# Patient Record
Sex: Male | Born: 1935 | Race: White | Hispanic: No | Smoking: Never smoker
Health system: Southern US, Community
[De-identification: ages and names within clinical notes are randomized; demographics above are authoritative.]

## PROBLEM LIST (undated history)

## (undated) DIAGNOSIS — I2510 Atherosclerotic heart disease of native coronary artery without angina pectoris: Secondary | ICD-10-CM

## (undated) DIAGNOSIS — Z951 Presence of aortocoronary bypass graft: Secondary | ICD-10-CM

## (undated) DIAGNOSIS — N189 Chronic kidney disease, unspecified: Secondary | ICD-10-CM

## (undated) DIAGNOSIS — K279 Peptic ulcer, site unspecified, unspecified as acute or chronic, without hemorrhage or perforation: Secondary | ICD-10-CM

## (undated) DIAGNOSIS — F329 Major depressive disorder, single episode, unspecified: Secondary | ICD-10-CM

## (undated) DIAGNOSIS — E785 Hyperlipidemia, unspecified: Secondary | ICD-10-CM

## (undated) DIAGNOSIS — F32A Depression, unspecified: Secondary | ICD-10-CM

## (undated) DIAGNOSIS — G459 Transient cerebral ischemic attack, unspecified: Secondary | ICD-10-CM

## (undated) DIAGNOSIS — I10 Essential (primary) hypertension: Secondary | ICD-10-CM

## (undated) DIAGNOSIS — C9190 Lymphoid leukemia, unspecified not having achieved remission: Secondary | ICD-10-CM

## (undated) HISTORY — DX: Peptic ulcer, site unspecified, unspecified as acute or chronic, without hemorrhage or perforation: K27.9

## (undated) HISTORY — DX: Atherosclerotic heart disease of native coronary artery without angina pectoris: I25.10

## (undated) HISTORY — DX: Lymphoid leukemia, unspecified not having achieved remission: C91.90

## (undated) HISTORY — DX: Essential (primary) hypertension: I10

## (undated) HISTORY — DX: Presence of aortocoronary bypass graft: Z95.1

## (undated) HISTORY — DX: Major depressive disorder, single episode, unspecified: F32.9

## (undated) HISTORY — DX: Hyperlipidemia, unspecified: E78.5

## (undated) HISTORY — DX: Chronic kidney disease, unspecified: N18.9

## (undated) HISTORY — PX: CHOLECYSTECTOMY: SHX55

## (undated) HISTORY — DX: Transient cerebral ischemic attack, unspecified: G45.9

## (undated) HISTORY — DX: Depression, unspecified: F32.A

## (undated) HISTORY — PX: CORONARY ARTERY BYPASS GRAFT: SHX141

---

## 1996-08-17 DIAGNOSIS — Z951 Presence of aortocoronary bypass graft: Secondary | ICD-10-CM

## 1996-08-17 HISTORY — DX: Presence of aortocoronary bypass graft: Z95.1

## 2006-09-08 ENCOUNTER — Ambulatory Visit: Payer: Self-pay | Admitting: Specialist

## 2007-06-30 ENCOUNTER — Ambulatory Visit: Payer: Self-pay | Admitting: Internal Medicine

## 2008-07-20 ENCOUNTER — Ambulatory Visit: Payer: Self-pay | Admitting: Internal Medicine

## 2008-08-07 ENCOUNTER — Encounter: Payer: Self-pay | Admitting: Cardiology

## 2008-08-30 ENCOUNTER — Encounter: Payer: Self-pay | Admitting: Cardiology

## 2008-10-09 ENCOUNTER — Encounter: Payer: Self-pay | Admitting: Cardiology

## 2008-10-25 ENCOUNTER — Encounter: Payer: Self-pay | Admitting: Cardiology

## 2008-10-25 LAB — CONVERTED CEMR LAB
Cholesterol: 179 mg/dL
Triglycerides: 195 mg/dL

## 2009-04-10 ENCOUNTER — Encounter: Payer: Self-pay | Admitting: Cardiology

## 2009-04-15 ENCOUNTER — Ambulatory Visit: Payer: Self-pay | Admitting: Cardiology

## 2009-04-15 DIAGNOSIS — R079 Chest pain, unspecified: Secondary | ICD-10-CM

## 2009-04-15 DIAGNOSIS — E785 Hyperlipidemia, unspecified: Secondary | ICD-10-CM | POA: Insufficient documentation

## 2009-04-15 DIAGNOSIS — Z951 Presence of aortocoronary bypass graft: Secondary | ICD-10-CM

## 2009-04-15 DIAGNOSIS — R0602 Shortness of breath: Secondary | ICD-10-CM

## 2009-04-15 DIAGNOSIS — I25810 Atherosclerosis of coronary artery bypass graft(s) without angina pectoris: Secondary | ICD-10-CM

## 2009-04-16 ENCOUNTER — Encounter: Payer: Self-pay | Admitting: Cardiology

## 2009-04-17 ENCOUNTER — Encounter: Payer: Self-pay | Admitting: Cardiology

## 2009-04-17 ENCOUNTER — Ambulatory Visit: Payer: Self-pay | Admitting: Oncology

## 2009-04-17 LAB — CONVERTED CEMR LAB
Albumin: 4.3 g/dL
Alkaline Phosphatase: 57 units/L
BUN: 20 mg/dL
CO2: 23 meq/L
CRP: 0.2 mg/dL
Calcium: 9.6 mg/dL
Creatinine, Ser: 1.37 mg/dL
Potassium: 4.4 meq/L
Sodium: 136 meq/L
Total Protein: 8.7 g/dL

## 2009-04-19 LAB — CONVERTED CEMR LAB
CO2: 21 meq/L (ref 19–32)
Chloride: 103 meq/L (ref 96–112)
Creatinine, Ser: 1.9 mg/dL — ABNORMAL HIGH (ref 0.40–1.50)
Potassium: 4.3 meq/L (ref 3.5–5.3)

## 2009-04-25 ENCOUNTER — Ambulatory Visit: Payer: Self-pay | Admitting: Oncology

## 2009-05-17 ENCOUNTER — Ambulatory Visit: Payer: Self-pay | Admitting: Oncology

## 2009-06-17 ENCOUNTER — Ambulatory Visit: Payer: Self-pay | Admitting: Oncology

## 2009-07-17 ENCOUNTER — Ambulatory Visit: Payer: Self-pay | Admitting: Oncology

## 2009-07-17 DIAGNOSIS — C9190 Lymphoid leukemia, unspecified not having achieved remission: Secondary | ICD-10-CM

## 2009-07-17 HISTORY — DX: Lymphoid leukemia, unspecified not having achieved remission: C91.90

## 2009-08-17 ENCOUNTER — Ambulatory Visit: Payer: Self-pay | Admitting: Oncology

## 2009-09-17 ENCOUNTER — Ambulatory Visit: Payer: Self-pay | Admitting: Oncology

## 2009-10-15 ENCOUNTER — Ambulatory Visit: Payer: Self-pay | Admitting: Oncology

## 2009-11-15 ENCOUNTER — Ambulatory Visit: Payer: Self-pay | Admitting: Oncology

## 2009-11-26 ENCOUNTER — Ambulatory Visit: Payer: Self-pay | Admitting: Internal Medicine

## 2009-11-30 ENCOUNTER — Inpatient Hospital Stay: Payer: Self-pay | Admitting: Internal Medicine

## 2009-12-15 ENCOUNTER — Ambulatory Visit: Payer: Self-pay | Admitting: Oncology

## 2010-01-15 ENCOUNTER — Ambulatory Visit: Payer: Self-pay | Admitting: Oncology

## 2010-02-14 ENCOUNTER — Ambulatory Visit: Payer: Self-pay | Admitting: Oncology

## 2010-03-17 ENCOUNTER — Ambulatory Visit: Payer: Self-pay | Admitting: Oncology

## 2010-04-17 ENCOUNTER — Ambulatory Visit: Payer: Self-pay | Admitting: Oncology

## 2010-05-17 ENCOUNTER — Ambulatory Visit: Payer: Self-pay | Admitting: Oncology

## 2010-06-17 ENCOUNTER — Ambulatory Visit: Payer: Self-pay | Admitting: Oncology

## 2010-07-03 ENCOUNTER — Inpatient Hospital Stay: Payer: Self-pay | Admitting: Specialist

## 2010-07-03 ENCOUNTER — Ambulatory Visit: Payer: Self-pay | Admitting: Cardiovascular Disease

## 2010-07-10 ENCOUNTER — Encounter: Payer: Self-pay | Admitting: Cardiology

## 2010-07-17 ENCOUNTER — Ambulatory Visit: Payer: Self-pay | Admitting: Oncology

## 2010-08-17 ENCOUNTER — Ambulatory Visit: Payer: Self-pay | Admitting: Oncology

## 2010-08-19 ENCOUNTER — Encounter: Payer: Self-pay | Admitting: Cardiology

## 2010-08-20 ENCOUNTER — Encounter: Payer: Self-pay | Admitting: Cardiology

## 2010-09-03 ENCOUNTER — Telehealth (INDEPENDENT_AMBULATORY_CARE_PROVIDER_SITE_OTHER): Payer: Self-pay | Admitting: *Deleted

## 2010-09-03 ENCOUNTER — Encounter: Payer: Self-pay | Admitting: Cardiology

## 2010-09-03 ENCOUNTER — Ambulatory Visit
Admission: RE | Admit: 2010-09-03 | Discharge: 2010-09-03 | Payer: Self-pay | Source: Home / Self Care | Attending: Cardiology | Admitting: Cardiology

## 2010-09-03 DIAGNOSIS — I4891 Unspecified atrial fibrillation: Secondary | ICD-10-CM | POA: Insufficient documentation

## 2010-09-04 ENCOUNTER — Encounter: Payer: Self-pay | Admitting: Cardiology

## 2010-09-04 ENCOUNTER — Telehealth: Payer: Self-pay | Admitting: Cardiology

## 2010-09-10 ENCOUNTER — Ambulatory Visit: Admit: 2010-09-10 | Payer: Self-pay

## 2010-09-17 ENCOUNTER — Ambulatory Visit: Payer: Self-pay | Admitting: Oncology

## 2010-09-17 ENCOUNTER — Encounter (INDEPENDENT_AMBULATORY_CARE_PROVIDER_SITE_OTHER): Payer: Medicare Other

## 2010-09-17 ENCOUNTER — Encounter: Payer: Self-pay | Admitting: Cardiovascular Disease

## 2010-09-17 DIAGNOSIS — I4891 Unspecified atrial fibrillation: Secondary | ICD-10-CM

## 2010-09-17 DIAGNOSIS — Z7901 Long term (current) use of anticoagulants: Secondary | ICD-10-CM

## 2010-09-17 LAB — CONVERTED CEMR LAB: POC INR: 1.1

## 2010-09-18 ENCOUNTER — Telehealth: Payer: Self-pay | Admitting: Cardiology

## 2010-09-18 NOTE — Letter (Signed)
Summary: Self-Recorded Med List and Vitals  Self-Recorded Med List and Vitals   Imported By: Marylou Mccoy 09/11/2010 10:43:57  _____________________________________________________________________  External Attachment:    Type:   Image     Comment:   External Document

## 2010-09-18 NOTE — Progress Notes (Addendum)
Summary: question on meds  Phone Note Call from Patient Call back at [phone]   Caller: Daughter/kaye Reason for Call: Talk to Nurse Summary of Call: pt daughter states pt does not remember what the nurse or doctor told him re his meds. pt daughter Purnell Shoemaker has question re his meds. Initial call taken by: Roe Coombs,  September 04, 2010 10:42 AM  Follow-up for Phone Call        Pt daughter call Joyce Gross [phone] Judie Grieve  September 04, 2010 1:03 PM  Additional Follow-up for Phone Call Additional follow up Details #1::        I spoke with daughter, Joyce Gross, and reviewed changes to her father's medications with her.  I have mailed her an updated instruction list and order for fasting lipid & liver at [facility] for March.  Mylo Red RN Additional Follow-up by: Lisabeth Devoid RN,  September 04, 2010 2:10 PM     Appended Document: question on meds Dr. Elias Else, oncologist in [place], reviewed Mr. Nigh chart.  Approved starting coumadin. Mylo Red RN  Appended Document: question on meds Dr. Monia Pouch office just called again recommended by MD to keep INR between 1.5-1.7 I reassured them we would carefully monitor his levels. I will forward to Coumadin clinic. Mylo Red RN  Appended Document: question on meds he needs coumadin INR of 2. Why did Dr Haynes Bast say that?  Reviewed Juanito Doom, MD  Appended Document: question on meds Dr. Ernestine Conrad office aware will keep INR  ~2 per Dr. Daleen Squibb.  RN stated Dr. Kern Reap reluctantly agrees.  Spoke with pt's daughterJoyce Gross.  She is aware to start warfarin 2.5mg  daily.  Appt made for pt to have INR checked within the next week.    Clinical Lists Changes  Medications: Added new medication of WARFARIN SODIUM 2.5 MG TABS (WARFARIN SODIUM) Use as directed by Anticoagualtion Clinic - Signed Rx of WARFARIN SODIUM 2.5 MG TABS (WARFARIN SODIUM) Use as directed by Anticoagualtion Clinic;  #30 x 1;  Signed;  Entered by: Weston Brass PharmD;  Authorized by:  Gaylord Shih, MD, [facility];  Method used: [facility] [phone]*, [address] [place], [place], [place]  [address], Ph: [phone] or [phone], Fax: [phone]    Prescriptions: WARFARIN SODIUM 2.5 MG TABS (WARFARIN SODIUM) Use as directed by Anticoagualtion Clinic  #30 x 1   Entered by:   Weston Brass PharmD   Authorized by:   Gaylord Shih, MD, [facility]   Signed by:   Weston Brass PharmD on 09/09/2010   Method used:   Electronically to        [facility] [phone]* (retail)       [address] [place], [place]  [address]       Ph: [phone] or [phone]       Fax: [phone]   RxID:   [id number]

## 2010-09-18 NOTE — Progress Notes (Signed)
  ROi faxed to [facility],records received back gave to Debbie/[facility]  September 03, 2010 1:08 PM

## 2010-09-18 NOTE — Assessment & Plan Note (Addendum)
Summary: EC6. NEW ONSET ATRIAL AFIB. PT HAS PARTNER MEDICARE.,GD   Visit Type:  new pt visit Primary Provider:  Dr. Darrick Huntsman  CC:  pt changeing from Dr. Shirlee Latch new onset of AFIB......sob.....denies any cp or edema.  History of Present Illness: [name] comes today referred by Dr.Tullo for an episode of atrial fibrillation, shortness of breath, and dyspnea on exertion.  He has a history of coronary bypass grafting at [facility] about 12-13 years ago. He saw Dr. Jearld Pies in 2010 for dyspnea on  exertion and shortness of breath. A 2-D echocardiogram and stress Myoview were ordered at that time but the patient did not return because he developed multiple myeloma. He is now on oral chemotherapy.  At that time electrolytes were normal and a BNP was normal.  Was hospitalized in [place] prior to Thanksgiving with pneumonia. He was treated with steroids and antibiotics. He has chronic renal insufficiency and his creatinine at discharge was 2.83.  An echocardiogram at that time showed ejection fraction of 50-55%, mildly dilated left atrium, normal right ventricular systolic pressure, trace aortic insufficiency, and evidence of diastolic dysfunction. There was no LVH.  Looking through his records, his hemoglobin was 9.5 on August 28, 2010. His creatinine was down to 1.8 He has tested positive for MRSA.  On January 3, he was in Dr.Tullo's office and felt to be in atrial fibrillation. EKG that day I have reviewed. It is very difficult tracing but I do think it is atrial fibrillation at a rate of about 80-90 beats a minute.  The biggest complaint is 2 weeks of increased dyspnea on exertion. His nurse assistant says that he is significantly worse than he was in terms of functional capacity even after discharge the hospital. He denies orthopnea PND or edema. He's had no palpitations or chest discomfort.  Current Medications (verified): 1)  Lexapro 20 Mg Tabs (Escitalopram Oxalate) .Marland Kitchen.. 1 Tab Once Daily 2)   Simvastatin 40 Mg Tabs (Simvastatin) .... Take One Tablet By Mouth Daily At Bedtime 3)  Amlodipine Besylate 10 Mg Tabs (Amlodipine Besylate) .Marland Kitchen.. 1 Tab Once Daily 4)  Finasteride 5 Mg Tabs (Finasteride) .Marland Kitchen.. 1 Tab Once Daily 5)  Tamsulosin Hcl 0.4 Mg Caps (Tamsulosin Hcl) .Marland Kitchen.. 1 By Mouth Once Daily 6)  Donepezil Hcl 5 Mg Tabs (Donepezil Hcl) .Marland Kitchen.. 1 Tab Once Daily 7)  Metoprolol Tartrate 25 Mg Tabs (Metoprolol Tartrate) .Marland Kitchen.. 1 Tab Two Times A Day 8)  Symbicort 160-4.5 Mcg/act Aero (Budesonide-Formoterol Fumarate) .... As Needed 9)  Aspirin 81 Mg Tbec (Aspirin) .... Take One Tablet By Mouth Daily 10)  Tylenol Extra Strength 500 Mg Tabs (Acetaminophen) .... 2 Tab Once Daily 11)  Stool Softener 100 Mg Caps (Docusate Sodium) .... As Needed 12)  Revlimid 5 Mg Caps (Lenalidomide) .Marland Kitchen.. 1 Tab For 21 Days, 7 Days Off Then Repeat  Allergies (verified): No Known Drug Allergies  Past History:  Past Medical History: Last updated: 04/15/2009 1. CAD: MI 1998.  CABG 1998 at [facility]: LIMA-LAD, SVG-RCA, SVG-OM1, SVG-D1.  2. HTN 3. Hyperlipidemia 4. CCY 5. PUD (years ago) 6. Depression 7. ? TIA: episode of altered mental status in 12/09.  EEG negative, carotids normal.   Past Surgical History: Last updated: 04/15/2009 CHOLECYSTECTOMY >20YEARS AGO CABG  Family History: Last updated: 04/15/2009 CANCER-UNKNOWN TYPE BROTHER MOTHER LIVED UNTIL HER 90'S FATHER: CAD (MI in his 101s) BROTHER: CAD  Social History: Last updated: 04/15/2009 Tobacco Use - No.  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  Lives at [facility], 3 children  Risk Factors: Alcohol Use: 0 (04/15/2009) Caffeine Use: 3/4 cups a day (04/15/2009) Exercise: no (04/15/2009)  Risk Factors: Smoking Status: never (04/15/2009)  Review of Systems       negative other than history of present illness  Vital Signs:  Patient profile:   77 year old male Height:      66  inches Weight:      181.50 pounds BMI:     29.40 Pulse rate:   50 / minute Pulse rhythm:   irregular BP sitting:   128 / 54  (left arm) Cuff size:   large  Vitals Entered By: Danielle Rankin, CMA (September 03, 2010 11:27 AM)  Physical Exam  General:  elderly, no acute distress, pale Head:  normocephalic and atraumatic Eyes:  PERRLA/EOM intact; conjunctiva and lids normal. Neck:  Neck supple, no JVD. No masses, thyromegaly or abnormal cervical nodes. Lungs:  Clear bilaterally to auscultation and percussion. Heart:  PMI nondisplaced, slow regular rate and rhythm, soft systolic murmur along left sternal border, no obvious carotid bruits Abdomen:  soft, and nondistended, good bowel sounds Msk:  decreased ROM.   Pulses:  present but reduced in the lower extremities Extremities:  trivial edema. Neurologic:  Alert and oriented x 3. Skin:  Intact without lesions or rashes. Psych:  Normal affect.   Impression & Recommendations:  Problem # 1:  SHORTNESS OF BREATH (ICD-786.05) Assessment Deteriorated This has gotten significantly worse in the last couple weeks. Need to rule out coronary ischemia. It could be multifactorial secondary to deconditioning, posthospitalization, chronic anemia, increased effort to walk. The following medications were removed from the medication list:    Diovan Hct 160-12.5 Mg Tabs (Valsartan-hydrochlorothiazide) .Marland Kitchen... 1 by mouth once daily His updated medication list for this problem includes:    Amlodipine Besylate 10 Mg Tabs (Amlodipine besylate) .Marland Kitchen... 1 tab once daily    Metoprolol Tartrate 25 Mg Tabs (Metoprolol tartrate) .Marland Kitchen... 1 tab two times a day    Aspirin 81 Mg Tbec (Aspirin) .Marland Kitchen... Take one tablet by mouth daily  Problem # 2:  ATRIAL FIBRILLATION (ICD-427.31) He is in sinus bradycardia today. He may be too slow. Now that I have reviewed his records and thought he had atrial fib with a fairly well-controlled ventricular rate, we'll decrease his metoprolol to  12 and half milligrams twice a day. He also needs to be placed on Coumadin. We'll arrange initiation and followup in the [facility] office. Continue aspirin 81 mg a day for coronary disease. His updated medication list for this problem includes:    Metoprolol Tartrate 25 Mg Tabs (Metoprolol tartrate) .Marland Kitchen... 1 tab two times a day    Aspirin 81 Mg Tbec (Aspirin) .Marland Kitchen... Take one tablet by mouth daily  Orders: Nuclear Stress Test (Nuc Stress Test)  Problem # 3:  CORONARY ARTERY BYPASS GRAFT, HX OF (ICD-V45.81) Assessment: Unchanged  Orders: EKG w/ Interpretation (93000) Nuclear Stress Test (Nuc Stress Test)  Problem # 4:  HYPERLIPIDEMIA-MIXED (ICD-272.4) Because of FDA guidelines, will suggest changing to atorvastatin 20 mg a day. Will need followup blood work in 6-8 weeks. His updated medication list for this problem includes:    Simvastatin 40 Mg Tabs (Simvastatin) .Marland Kitchen... Take one tablet by mouth daily at bedtime  Other Orders: Carotid Duplex (Carotid Duplex)  Patient Instructions: 1)  Your physician recommends that you schedule a follow-up appointment in: 3-4 weeks same day as lexiscan and ECHO 2)  Your physician recommends that you continue on your current medications as directed. Please refer to the Current Medication list given to you today. 3)  Your physician has requested that you have an echocardiogram.  Echocardiography is a painless test that uses sound waves to create images of your heart. It provides your doctor with information about the size and shape of your heart and how well your heart's chambers and valves are working.  This procedure takes approximately one hour. There are no restrictions for this procedure. SAME DAY AS APPT  4)  Your physician has requested that you have an adenosine myoview.  For further information please visit [email or web address]  Please follow instruction sheet, as given. 5)  Your physician has requested that you have a carotid duplex. This test is an  ultrasound of the carotid arteries in your neck. It looks at blood flow through these arteries that supply the brain with blood. Allow one hour for this exam. There are no restrictions or special instructions. Same day appt  Appended Document: EC6. NEW ONSET ATRIAL AFIB. PT HAS PARTNER MEDICARE.,GD I spoke with caregiver, Steward Drone, and will fax new instructions and prescriptions to [phone] at [facility].  Mr. Sjogren daughter, Joyce Gross [phone]  will call office back. Mylo Red RN   Clinical Lists Changes  Medications: Changed medication from SIMVASTATIN 40 MG TABS (SIMVASTATIN) Take one tablet by mouth daily at bedtime to LIPITOR 20 MG TABS (ATORVASTATIN CALCIUM) Take 1 tablet at bedtime - Signed Changed medication from METOPROLOL TARTRATE 25 MG TABS (METOPROLOL TARTRATE) 1 tab two times a day to METOPROLOL TARTRATE 25 MG TABS (METOPROLOL TARTRATE) Take 1/2 tablet twice a day Rx of LIPITOR 20 MG TABS (ATORVASTATIN CALCIUM) Take 1 tablet at bedtime;  #30 x 11;  Signed;  Entered by: Lisabeth Devoid RN;  Authorized by: Gaylord Shih, MD, [facility];  Method used: Electronically to [facility]. [phone]*, [address] [place], [place], [place]  [address], Ph: [phone] or [phone], Fax: [phone] Orders: Added new Referral order of Misc. Referral (Misc. Ref) - Signed Observations: Added new observation of PI CARDIO: Your physician recommends that you return for a FASTING lipid profile and liver in: 6-8 weeks.  272.4 Your physician has recommended you make the following change in your medication: Please see changes below: You have been referred to the Coumadin clinic in [place]. (09/04/2010 10:18)    Prescriptions: LIPITOR 20 MG TABS (ATORVASTATIN CALCIUM) Take 1 tablet at bedtime  #30 x 11   Entered by:   Lisabeth Devoid RN   Authorized by:   Gaylord Shih, MD, [facility]   Signed by:   Lisabeth Devoid RN on 09/04/2010   Method used:   Electronically to        [facility] [phone]* (retail)        [address] [place], [place]  [address]       Ph: [phone] or [phone]       Fax: [phone]   RxID:   [id number]     Patient Instructions: 1)  Your physician recommends that you return for a FASTING lipid profile and liver in: 6-8 weeks.  272.4 2)  Your physician has recommended you make the following change in your medication: Please see changes below: 3)  You have been referred to the Coumadin clinic in [place].

## 2010-09-18 NOTE — Letter (Signed)
Summary: [facility]  [facility] 2011   Imported By: Marylou Mccoy 09/03/2010 17:40:54  _____________________________________________________________________  External Attachment:    Type:   Image     Comment:   External Document

## 2010-09-20 ENCOUNTER — Inpatient Hospital Stay: Payer: Self-pay | Admitting: Internal Medicine

## 2010-09-24 ENCOUNTER — Encounter: Payer: Self-pay | Admitting: Cardiovascular Disease

## 2010-09-24 ENCOUNTER — Encounter (INDEPENDENT_AMBULATORY_CARE_PROVIDER_SITE_OTHER): Payer: Medicare Other

## 2010-09-24 DIAGNOSIS — I4891 Unspecified atrial fibrillation: Secondary | ICD-10-CM

## 2010-09-24 DIAGNOSIS — Z7901 Long term (current) use of anticoagulants: Secondary | ICD-10-CM

## 2010-09-24 LAB — CONVERTED CEMR LAB: POC INR: 1.1

## 2010-09-24 NOTE — Medication Information (Signed)
Summary: rov/sp  Anticoagulant Therapy  Managed by: Cloyde Reams, RN, BSN Referring MD: Dr Valera Castle PCP: Dr. Benson Setting MD: Mariah Milling Indication 1: Atrial Fibrillation Lab Used: [facility] [place] Site: [place] INR POC 1.1 INR RANGE 2.0-2.0  Dietary changes: no    Health status changes: no    Bleeding/hemorrhagic complications: no    Recent/future hospitalizations: no    Any changes in medication regimen? no    Recent/future dental: no  Any missed doses?: no       Is patient compliant with meds? yes      Comments: Started taking 2.5mg  daily on 09/09/10.  New pt education done at [facility] with pt and caregivers.  Educated pt on importance of dietary consistancy of vitamin K, calling with any new medication, or changes in current regimen.  Educated on importance of monitoring and calling with any signs or symptoms of abnormal bleeding.  Pt educated on importance of medication compliance.    Allergies: No Known Drug Allergies  Anticoagulation Management History:      The patient is taking warfarin and comes in today for a routine follow up visit.  Positive risk factors for bleeding include an age of 75 years or older.  The bleeding index is 'intermediate risk'.  Positive CHADS2 values include Age > 25 years old.  Anticoagulation responsible provider: [facility].  INR POC: 1.1.  Cuvette Lot#: [id number].  Exp: 09/2011.    Anticoagulation Management Assessment/Plan:      The patient's current anticoagulation dose is Warfarin sodium 2.5 mg tabs: Use as directed by Anticoagualtion Clinic.  The target INR is 2.0-2.0.  The next INR is due 09/24/2010.  Results were reviewed/authorized by Cloyde Reams, RN, BSN.  He was notified by Cloyde Reams RN.         Current Anticoagulation Instructions: INR 1.1  Start taking 1.5 tablets (3.75mg ) daily.  Recheck in 1 week.

## 2010-09-24 NOTE — Progress Notes (Signed)
Summary: Additional Medications  Phone Note Call from Patient Call back at [phone]   Caller: Kathlene November (Son) Call For: Daleen Squibb Summary of Call: Pt has started taking a 7 day dosage of Augmentin 875-125 mg 1 tablet 2 times a day.  How is this going to affect his Coumadin?  Has also been given Methocarbamol 500 mg 1/2 to 1 tablet to take as needed.   Cell #[phone] Initial call taken by: Harlon Flor,  September 18, 2010 9:54 AM  Follow-up for Phone Call        Called spoke with pt's son Kathlene November advised no interaction between Coumadin and Augmentin.  Methocarbanol ok as needed as well.  Advised to monitor for side effects, GI upset, diarrhea.  If occurs please call our office.  Follow-up by: Cloyde Reams RN,  September 18, 2010 10:34 AM

## 2010-09-29 ENCOUNTER — Telehealth (INDEPENDENT_AMBULATORY_CARE_PROVIDER_SITE_OTHER): Payer: Self-pay | Admitting: *Deleted

## 2010-10-01 ENCOUNTER — Ambulatory Visit (INDEPENDENT_AMBULATORY_CARE_PROVIDER_SITE_OTHER): Payer: Medicare Other | Admitting: Cardiology

## 2010-10-01 ENCOUNTER — Ambulatory Visit (HOSPITAL_BASED_OUTPATIENT_CLINIC_OR_DEPARTMENT_OTHER): Payer: Medicare Other

## 2010-10-01 ENCOUNTER — Encounter: Payer: Self-pay | Admitting: Cardiology

## 2010-10-01 ENCOUNTER — Encounter (INDEPENDENT_AMBULATORY_CARE_PROVIDER_SITE_OTHER): Payer: Medicare Other

## 2010-10-01 ENCOUNTER — Encounter: Payer: Self-pay | Admitting: Internal Medicine

## 2010-10-01 ENCOUNTER — Ambulatory Visit (HOSPITAL_COMMUNITY): Payer: Medicare Other | Attending: Cardiology

## 2010-10-01 DIAGNOSIS — I10 Essential (primary) hypertension: Secondary | ICD-10-CM | POA: Insufficient documentation

## 2010-10-01 DIAGNOSIS — I2510 Atherosclerotic heart disease of native coronary artery without angina pectoris: Secondary | ICD-10-CM

## 2010-10-01 DIAGNOSIS — I6529 Occlusion and stenosis of unspecified carotid artery: Secondary | ICD-10-CM

## 2010-10-01 DIAGNOSIS — E785 Hyperlipidemia, unspecified: Secondary | ICD-10-CM | POA: Insufficient documentation

## 2010-10-01 DIAGNOSIS — Z7901 Long term (current) use of anticoagulants: Secondary | ICD-10-CM

## 2010-10-01 DIAGNOSIS — I079 Rheumatic tricuspid valve disease, unspecified: Secondary | ICD-10-CM | POA: Insufficient documentation

## 2010-10-01 DIAGNOSIS — R0609 Other forms of dyspnea: Secondary | ICD-10-CM | POA: Insufficient documentation

## 2010-10-01 DIAGNOSIS — I498 Other specified cardiac arrhythmias: Secondary | ICD-10-CM

## 2010-10-01 DIAGNOSIS — I080 Rheumatic disorders of both mitral and aortic valves: Secondary | ICD-10-CM | POA: Insufficient documentation

## 2010-10-01 DIAGNOSIS — R0989 Other specified symptoms and signs involving the circulatory and respiratory systems: Secondary | ICD-10-CM

## 2010-10-01 DIAGNOSIS — I379 Nonrheumatic pulmonary valve disorder, unspecified: Secondary | ICD-10-CM | POA: Insufficient documentation

## 2010-10-01 DIAGNOSIS — C9000 Multiple myeloma not having achieved remission: Secondary | ICD-10-CM | POA: Insufficient documentation

## 2010-10-01 DIAGNOSIS — I491 Atrial premature depolarization: Secondary | ICD-10-CM

## 2010-10-01 DIAGNOSIS — I4891 Unspecified atrial fibrillation: Secondary | ICD-10-CM

## 2010-10-01 LAB — CONVERTED CEMR LAB: POC INR: 1.5

## 2010-10-02 NOTE — Medication Information (Signed)
Summary: Coumadin Clinic  Anticoagulant Therapy  Managed by: Cloyde Reams, RN, BSN Referring MD: Dr Valera Castle PCP: Dr. Benson Setting MD: Mariah Milling Indication 1: Atrial Fibrillation Lab Used: [facility] [place] Site: [place] INR POC 1.1 INR RANGE 2.0-2.0   Health status changes: no    Bleeding/hemorrhagic complications: no    Recent/future hospitalizations: yes       Details: Pt went to [facility] on Friday 2/3 discharged on 09/21/10  Any changes in medication regimen? yes       Details: Continues on abx off Augmentin, but now on Keflex 500mg  tid x 7 days has about 4 doses left.   Recent/future dental: no  Any missed doses?: yes     Details: Pt was in hospital unsure of Coumadin dosage or administration while in hospital.   Is patient compliant with meds? yes       Allergies: No Known Drug Allergies  Anticoagulation Management History:      The patient is taking warfarin and comes in today for a routine follow up visit.  Positive risk factors for bleeding include an age of 75 years or older.  The bleeding index is 'intermediate risk'.  Positive CHADS2 values include Age > 75 years old.  Anticoagulation responsible provider: [facility].  INR POC: 1.1.  Cuvette Lot#: [id number].  Exp: 09/2011.    Anticoagulation Management Assessment/Plan:      The patient's current anticoagulation dose is Warfarin sodium 5 mg tabs: Use as directed by Anticoagulation Clinic.  The target INR is 2.0-2.0.  The next INR is due 10/01/2010.  Results were reviewed/authorized by Cloyde Reams, RN, BSN.  He was notified by Cloyde Reams RN.         Prior Anticoagulation Instructions: INR 1.1  Start taking 1.5 tablets (3.75mg ) daily.  Recheck in 1 week.  Current Anticoagulation Instructions: INR 1.1  Start taking 5mg  daily.  Recheck in 1 week.   Prescriptions: WARFARIN SODIUM 5 MG TABS (WARFARIN SODIUM) Use as directed by Anticoagulation Clinic  #45 x 1   Entered by:   Cloyde Reams RN  Authorized by:   Gaylord Shih, MD, [facility]   Signed by:   Cloyde Reams RN on 09/24/2010   Method used:   Electronically to        [facility] [phone]* (retail)       [address] [place], [place]  [address]       Ph: [phone] or [phone]       Fax: [phone]   RxID:   [id number]

## 2010-10-08 ENCOUNTER — Encounter: Payer: Self-pay | Admitting: Cardiovascular Disease

## 2010-10-08 ENCOUNTER — Encounter (INDEPENDENT_AMBULATORY_CARE_PROVIDER_SITE_OTHER): Payer: Medicare Other

## 2010-10-08 DIAGNOSIS — I4891 Unspecified atrial fibrillation: Secondary | ICD-10-CM

## 2010-10-08 DIAGNOSIS — Z7901 Long term (current) use of anticoagulants: Secondary | ICD-10-CM

## 2010-10-08 NOTE — Progress Notes (Signed)
Summary: Nuclear Pre-Procedure  Phone Note Outgoing Call Call back at [facility] Phone [phone]   Call placed by: Stanton Kidney, EMT-P,  September 29, 2010 2:17 PM Action Taken: Phone Call Completed Summary of Call: Reviewed information on Myoview Information Sheet (see scanned document for further details).  Spoke with the patient's caregiver, Ivan Croft. Stanton Kidney, EMT-P  September 29, 2010 2:17 PM     Nuclear Med Background Indications for Stress Test: Evaluation for Ischemia, Graft Patency   History: CABG, Echo, Heart Catheterization, Myocardial Perfusion Study  History Comments: '98 MI > CABG x4 11/11 Echo: EF=50-55%  Symptoms: DOE    Nuclear Pre-Procedure Cardiac Risk Factors: Family History - CAD, Hypertension, Lipids, TIA Height (in): 66  Nuclear Med Study Referring MD:  Dr Valera Castle

## 2010-10-08 NOTE — Assessment & Plan Note (Signed)
Summary: fu myoview/echo/carotid/2-15-2@ 9:15 sl/ep   Visit Type:  Follow-up Primary Provider:  Dr. Darrick Huntsman  CC:  some sob at times. pt is now taking Methocarbamol for cramping in neck. no other cardiac complaints today.  History of Present Illness: [name] is back today for followup. He is feeling alittle stronger since visit. We decreased his Metoprolol because of bradycardia. Heart rate today is 60.  Carotid Dopplers show nonobstructive disease with antegrade flow in both vertebrals. Preliminary Myoview shows EF of 53% with mild inferior ischemia.  Current Medications (verified): 1)  Lexapro 20 Mg Tabs (Escitalopram Oxalate) .Marland Kitchen.. 1 Tab Once Daily 2)  Lipitor 20 Mg Tabs (Atorvastatin Calcium) .... Take 1 Tablet At Bedtime 3)  Amlodipine Besylate 10 Mg Tabs (Amlodipine Besylate) .Marland Kitchen.. 1 Tab Once Daily 4)  Finasteride 5 Mg Tabs (Finasteride) .Marland Kitchen.. 1 Tab Once Daily 5)  Tamsulosin Hcl 0.4 Mg Caps (Tamsulosin Hcl) .Marland Kitchen.. 1 By Mouth Once Daily 6)  Donepezil Hcl 5 Mg Tabs (Donepezil Hcl) .Marland Kitchen.. 1 Tab Once Daily 7)  Metoprolol Tartrate 25 Mg Tabs (Metoprolol Tartrate) .... Take 1/2 Tablet Twice A Day 8)  Symbicort 160-4.5 Mcg/act Aero (Budesonide-Formoterol Fumarate) .... As Needed 9)  Tylenol Extra Strength 500 Mg Tabs (Acetaminophen) .... 2 Tab Once Daily 10)  Stool Softener 100 Mg Caps (Docusate Sodium) .... As Needed 11)  Revlimid 5 Mg Caps (Lenalidomide) .Marland Kitchen.. 1 Tab For 21 Days, 7 Days Off Then Repeat 12)  Warfarin Sodium 5 Mg Tabs (Warfarin Sodium) .... Use As Directed By Anticoagulation Clinic 13)  Methocarbamol 500 Mg Tabs (Methocarbamol) .... 1/2 To 1 Tablet Every 12 Hrs As Needed  Allergies (verified): No Known Drug Allergies  Past History:  Past Medical History: Last updated: 04/15/2009 1. CAD: MI 1998.  CABG 1998 at [facility]: LIMA-LAD, SVG-RCA, SVG-OM1, SVG-D1.  2. HTN 3. Hyperlipidemia 4. CCY 5. PUD (years ago) 6. Depression 7. ? TIA: episode of altered mental status in 12/09.  EEG  negative, carotids normal.   Past Surgical History: Last updated: 04/15/2009 CHOLECYSTECTOMY >20YEARS AGO CABG  Family History: Last updated: 04/15/2009 CANCER-UNKNOWN TYPE BROTHER MOTHER LIVED UNTIL HER 90'S FATHER: CAD (MI in his 57s) BROTHER: CAD  Social History: Last updated: 04/15/2009 Tobacco Use - No.  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  Lives at [facility], 3 children  Risk Factors: Alcohol Use: 0 (04/15/2009) Caffeine Use: 3/4 cups a day (04/15/2009) Exercise: no (04/15/2009)  Risk Factors: Smoking Status: never (04/15/2009)  Review of Systems       negative other initial evaluation  Vital Signs:  Patient profile:   77 year old male Height:      66 inches Weight:      182.50 pounds Pulse rate:   60 / minute Resp:     18 per minute BP sitting:   130 / 62  (left arm) Cuff size:   large  Vitals Entered By: Celestia Khat, CMA (October 01, 2010 1:44 PM)  Physical Exam  General:  No change from previous exam except looks stronger Heart:  No change Extremities:  no edema   Impression & Recommendations:  Problem # 1:  ATRIAL FIBRILLATION (ICD-427.31) He is NSR today with improved heart rate. No changes. Increase INR to 2.0. His updated medication list for this problem includes:    Metoprolol Tartrate 25 Mg Tabs (Metoprolol tartrate) .Marland Kitchen... Take 1/2 tablet twice a day    Warfarin Sodium 5 Mg Tabs (  Warfarin sodium) ..... Use as directed by anticoagulation clinic    Aspirin 81 Mg Tbec (Aspirin) .Marland Kitchen... Take one tablet by mouth daily  Problem # 2:  CHEST PAIN UNSPECIFIED (ICD-786.50) Assessment: Unchanged  His updated medication list for this problem includes:    Amlodipine Besylate 10 Mg Tabs (Amlodipine besylate) .Marland Kitchen... 1 tab once daily    Metoprolol Tartrate 25 Mg Tabs (Metoprolol tartrate) .Marland Kitchen... Take 1/2 tablet twice a day    Warfarin Sodium 5 Mg Tabs (Warfarin sodium) ..... Use as  directed by anticoagulation clinic    Aspirin 81 Mg Tbec (Aspirin) .Marland Kitchen... Take one tablet by mouth daily  Problem # 3:  SHORTNESS OF BREATH (ICD-786.05) Assessment: Improved  His updated medication list for this problem includes:    Amlodipine Besylate 10 Mg Tabs (Amlodipine besylate) .Marland Kitchen... 1 tab once daily    Metoprolol Tartrate 25 Mg Tabs (Metoprolol tartrate) .Marland Kitchen... Take 1/2 tablet twice a day    Aspirin 81 Mg Tbec (Aspirin) .Marland Kitchen... Take one tablet by mouth daily  Problem # 4:  CORONARY ATHEROSCLEROSIS OF ARTERY BYPASS GRAFT (ICD-414.04)  His updated medication list for this problem includes:    Amlodipine Besylate 10 Mg Tabs (Amlodipine besylate) .Marland Kitchen... 1 tab once daily    Metoprolol Tartrate 25 Mg Tabs (Metoprolol tartrate) .Marland Kitchen... Take 1/2 tablet twice a day    Warfarin Sodium 5 Mg Tabs (Warfarin sodium) ..... Use as directed by anticoagulation clinic    Aspirin 81 Mg Tbec (Aspirin) .Marland Kitchen... Take one tablet by mouth daily  Problem # 5:  CAROTID ARTERY STENOSIS, WITHOUT INFARCTION (ICD-433.10) Assessment: New Add ASA 81mg  a day. His updated medication list for this problem includes:    Warfarin Sodium 5 Mg Tabs (Warfarin sodium) ..... Use as directed by anticoagulation clinic    Aspirin 81 Mg Tbec (Aspirin) .Marland Kitchen... Take one tablet by mouth daily  Patient Instructions: 1)  Your physician recommends that you schedule a follow-up appointment in: 1 year with Dr. Daleen Squibb 2)  Your physician has recommended you make the following change in your medication:

## 2010-10-08 NOTE — Medication Information (Signed)
Summary: Coumadin Clinic  Anticoagulant Therapy  Managed by: Weston Brass, PharmD Referring MD: Dr Valera Castle PCP: Dr. Darrick Huntsman Supervising MD: Gala Romney MD, Reuel Boom Indication 1: Atrial Fibrillation Lab Used: [facility] of [facility] [place] Site: [place] INR POC 1.5 INR RANGE 2.0-2.0  Dietary changes: no    Health status changes: no    Bleeding/hemorrhagic complications: no    Recent/future hospitalizations: no    Any changes in medication regimen? no    Recent/future dental: no  Any missed doses?: no       Is patient compliant with meds? yes       Allergies: No Known Drug Allergies  Anticoagulation Management History:      The patient is taking warfarin and comes in today for a routine follow up visit.  Positive risk factors for bleeding include an age of 25 years or older.  The bleeding index is 'intermediate risk'.  Positive CHADS2 values include Age > 73 years old.  Anticoagulation responsible provider: Bensimhon MD, Reuel Boom.  INR POC: 1.5.  Exp: 09/2011.    Anticoagulation Management Assessment/Plan:      The patient's current anticoagulation dose is Warfarin sodium 5 mg tabs: Use as directed by Anticoagulation Clinic.  The target INR is 2.0-2.0.  The next INR is due 10/08/2010.  Anticoagulation instructions were given to patient.  Results were reviewed/authorized by Weston Brass, PharmD.  He was notified by Weston Brass PharmD.         Prior Anticoagulation Instructions: INR 1.1  Start taking 5mg  daily.  Recheck in 1 week.    Current Anticoagulation Instructions: INR 1.5  Increase dose to 1 tablet every day except 1 1/2 tablets on Monday, Wednesday, and Friday.  Recheck INR in 1 weeks.

## 2010-10-08 NOTE — Assessment & Plan Note (Signed)
Summary: Cardiology Nuclear Testing  Nuclear Med Background Indications for Stress Test: Evaluation for Ischemia, Graft Patency   History: CABG, Echo, Heart Catheterization, Myocardial Perfusion Study  History Comments: '98 MI > CABG x4 11/11 Echo: EF=50-55%  Symptoms: DOE, Palpitations    Nuclear Pre-Procedure Cardiac Risk Factors: Family History - CAD, Hypertension, Lipids, TIA Caffeine/Decaff Intake: None NPO After: 6:00 PM Lungs: clear IV 0.9% NS with Angio Cath: 20g     IV Site: R Antecubital IV Started by: Stanton Kidney, EMT-P Chest Size (in) 46     Height (in): 66 Weight (lb): 183 BMI: 29.64  Nuclear Med Study 1 or 2 day study:  1 day     Stress Test Type:  Eugenie Birks Reading MD:  Marca Ancona, MD     Referring MD:  Dr Valera Castle Resting Radionuclide:  Technetium 82m Tetrofosmin     Resting Radionuclide Dose:  11.0 mCi  Stress Radionuclide:  Technetium 68m Tetrofosmin     Stress Radionuclide Dose:  33.0 mCi   Stress Protocol  Max Systolic BP: 129 mm Hg Lexiscan: 0.4 mg   Stress Test Technologist:  Milana Na, EMT-P     Nuclear Technologist:  Domenic Polite, CNMT  Rest Procedure  Myocardial perfusion imaging was performed at rest 45 minutes following the intravenous administration of Technetium 71m Tetrofosmin.  Stress Procedure  The patient received IV Lexiscan 0.4 mg over 15-seconds.  Technetium 86m Tetrofosmin injected at 30-seconds.  There were no significant changes and a rare pac with infusion.  Quantitative spect images were obtained after a 45 minute delay.  QPS Raw Data Images:  Normal; no motion artifact; normal heart/lung ratio. Stress Images:  Normal homogeneous uptake in all areas of the myocardium. Rest Images:  Normal homogeneous uptake in all areas of the myocardium. Subtraction (SDS):  There is no evidence of scar or ischemia. Transient Ischemic Dilatation:  1.09  (Normal <1.22)  Lung/Heart Ratio:  0.34  (Normal <0.45)  Quantitative  Gated Spect Images QGS EDV:  141 ml QGS ESV:  66 ml QGS EF:  53 % QGS cine images:  Septal hypokinesis   Overall Impression  Exercise Capacity: Lexiscan with no exercise. BP Response: Normal blood pressure response. Clinical Symptoms: Weakness ECG Impression: No significant ST segment change suggestive of ischemia. Overall Impression: No evidence for ischemia or infarction.  Overall Impression Comments: Low normal overall systolic function.  There does appear to be septal hypokinesis on gated images.   Appended Document: Cardiology Nuclear Testing low risk study, reassure sons. no change in treatment.  Reviewed Juanito Doom, MD  Appended Document: Cardiology Nuclear Testing Pt son, Kathlene November, is aware of results. Mylo Red RN

## 2010-10-14 NOTE — Consult Note (Signed)
Summary: [facility]: Consultation Report  [facility]: Consultation Report   Imported By: Earl Many 10/09/2010 13:19:51  _____________________________________________________________________  External Attachment:    Type:   Image     Comment:   External Document

## 2010-10-14 NOTE — Medication Information (Signed)
Summary: rov/ewj  Anticoagulant Therapy  Managed by: Cloyde Reams, RN, BSN Referring MD: Dr Valera Castle PCP: Dr. Benson Setting MD: Mariah Milling Indication 1: Atrial Fibrillation Lab Used: [facility] [place] Site: [place] INR POC 2.1 INR RANGE 2.0-2.0  Dietary changes: no    Health status changes: no    Bleeding/hemorrhagic complications: no    Recent/future hospitalizations: no    Any changes in medication regimen? no    Recent/future dental: no  Any missed doses?: no       Is patient compliant with meds? yes       Allergies: No Known Drug Allergies  Anticoagulation Management History:      The patient is taking warfarin and comes in today for a routine follow up visit.  Positive risk factors for bleeding include an age of 75 years or older.  The bleeding index is 'intermediate risk'.  Positive CHADS2 values include Age > 75 years old.  Anticoagulation responsible provider: gollan.  INR POC: 2.1.  Cuvette Lot#: [id number].  Exp: 08/2011.    Anticoagulation Management Assessment/Plan:      The patient's current anticoagulation dose is Warfarin sodium 5 mg tabs: Use as directed by Anticoagulation Clinic.  The target INR is 2.0-2.0.  The next INR is due 10/15/2010.  Anticoagulation instructions were given to patient.  Results were reviewed/authorized by Cloyde Reams, RN, BSN.  He was notified by Cloyde Reams RN.         Prior Anticoagulation Instructions: INR 1.5  Increase dose to 1 tablet every day except 1 1/2 tablets on Monday, Wednesday, and Friday.  Recheck INR in 1 weeks.   Current Anticoagulation Instructions: INR 2.1  Continue on same dosage 1 tablet daily ecept 1.5 tablets on Mondays, Wednesdays, and Fridays.  Recheck in 1 week.

## 2010-10-15 ENCOUNTER — Encounter: Payer: Self-pay | Admitting: Cardiovascular Disease

## 2010-10-15 ENCOUNTER — Encounter (INDEPENDENT_AMBULATORY_CARE_PROVIDER_SITE_OTHER): Payer: Medicare Other

## 2010-10-15 DIAGNOSIS — Z7901 Long term (current) use of anticoagulants: Secondary | ICD-10-CM

## 2010-10-15 DIAGNOSIS — I4891 Unspecified atrial fibrillation: Secondary | ICD-10-CM

## 2010-10-15 LAB — CONVERTED CEMR LAB: POC INR: 2.1

## 2010-10-16 ENCOUNTER — Ambulatory Visit: Payer: Self-pay | Admitting: Oncology

## 2010-10-23 NOTE — Medication Information (Signed)
Summary: rov/ewj  Anticoagulant Therapy  Managed by: Cloyde Reams, RN, BSN Referring MD: Dr Valera Castle PCP: Dr. Benson Setting MD: Mariah Milling Indication 1: Atrial Fibrillation Lab Used: [facility] [place] Site: [place] INR POC 2.1 INR RANGE 2.0-2.0  Dietary changes: no    Health status changes: no    Bleeding/hemorrhagic complications: no    Recent/future hospitalizations: no    Any changes in medication regimen? no    Recent/future dental: no  Any missed doses?: no       Is patient compliant with meds? yes       Allergies: No Known Drug Allergies  Anticoagulation Management History:      The patient is taking warfarin and comes in today for a routine follow up visit.  Positive risk factors for bleeding include an age of 2 years or older.  The bleeding index is 'intermediate risk'.  Positive CHADS2 values include Age > 27 years old.  Anticoagulation responsible provider: gollan.  INR POC: 2.1.  Cuvette Lot#: [id number].  Exp: 08/2011.    Anticoagulation Management Assessment/Plan:      The patient's current anticoagulation dose is Warfarin sodium 5 mg tabs: Use as directed by Anticoagulation Clinic.  The target INR is 2.0-2.0.  The next INR is due 10/29/2010.  Anticoagulation instructions were given to patient.  Results were reviewed/authorized by Cloyde Reams, RN, BSN.  He was notified by Cloyde Reams RN.         Prior Anticoagulation Instructions: INR 2.1  Continue on same dosage 1 tablet daily ecept 1.5 tablets on Mondays, Wednesdays, and Fridays.  Recheck in 1 week.    Current Anticoagulation Instructions: INR 2.1  Continue on same dosage 1 tablet daily except 1.5 tablets on Mondays, Wednesdays, and Fridays.  Recheck in 2 weeks.

## 2010-10-29 ENCOUNTER — Encounter (INDEPENDENT_AMBULATORY_CARE_PROVIDER_SITE_OTHER): Payer: Medicare Other

## 2010-10-29 ENCOUNTER — Encounter: Payer: Self-pay | Admitting: Cardiovascular Disease

## 2010-10-29 DIAGNOSIS — I4891 Unspecified atrial fibrillation: Secondary | ICD-10-CM

## 2010-10-29 DIAGNOSIS — Z7901 Long term (current) use of anticoagulants: Secondary | ICD-10-CM

## 2010-10-29 LAB — CONVERTED CEMR LAB: POC INR: 1.7

## 2010-11-04 NOTE — Medication Information (Signed)
Summary: rov/ewj  Anticoagulant Therapy  Managed by: Cloyde Reams, RN, BSN Referring MD: Dr Valera Castle PCP: Dr. Benson Setting MD: Mariah Milling Indication 1: Atrial Fibrillation Lab Used: [facility] [place] Site: [place] INR POC 1.7 INR RANGE 2.0-2.0  Dietary changes: no    Health status changes: no    Bleeding/hemorrhagic complications: no    Recent/future hospitalizations: no    Any changes in medication regimen? no    Recent/future dental: no  Any missed doses?: no       Is patient compliant with meds? yes       Allergies: No Known Drug Allergies  Anticoagulation Management History:      The patient is taking warfarin and comes in today for a routine follow up visit.  Positive risk factors for bleeding include an age of 75 years or older.  The bleeding index is 'intermediate risk'.  Positive CHADS2 values include Age > 10 years old.  Anticoagulation responsible provider: Marga Gramajo.  INR POC: 1.7.  Cuvette Lot#: [id number].  Exp: 08/2011.    Anticoagulation Management Assessment/Plan:      The patient's current anticoagulation dose is Warfarin sodium 5 mg tabs: Use as directed by Anticoagulation Clinic.  The target INR is 2.0-2.0.  The next INR is due 11/12/2010.  Anticoagulation instructions were given to patient.  Results were reviewed/authorized by Cloyde Reams, RN, BSN.  He was notified by Cloyde Reams RN.         Prior Anticoagulation Instructions: INR 2.1  Continue on same dosage 1 tablet daily except 1.5 tablets on Mondays, Wednesdays, and Fridays.  Recheck in 2 weeks.   Current Anticoagulation Instructions: INR 1.7  Take an extra 1/2 tablet today, then start taking 1.5 tablets daily except 1 tablet on Sundays, Tuesdays, and Thursdays.  Recheck in 2 weeks.

## 2010-11-07 ENCOUNTER — Encounter: Payer: Self-pay | Admitting: Cardiology

## 2010-11-07 DIAGNOSIS — Z7901 Long term (current) use of anticoagulants: Secondary | ICD-10-CM | POA: Insufficient documentation

## 2010-11-07 DIAGNOSIS — I4891 Unspecified atrial fibrillation: Secondary | ICD-10-CM

## 2010-11-12 ENCOUNTER — Ambulatory Visit (INDEPENDENT_AMBULATORY_CARE_PROVIDER_SITE_OTHER): Payer: Medicare Other | Admitting: Emergency Medicine

## 2010-11-12 DIAGNOSIS — Z7901 Long term (current) use of anticoagulants: Secondary | ICD-10-CM

## 2010-11-12 DIAGNOSIS — I4891 Unspecified atrial fibrillation: Secondary | ICD-10-CM

## 2010-11-12 LAB — POCT INR: INR: 2.3

## 2010-11-12 NOTE — Patient Instructions (Signed)
Take 1/2 tablet tomorrow, then resume same dosage 1.5 tablets daily except 1 tablet on Sundays, Tuesdays, and Thursdays.  Recheck in 3 weeks

## 2010-11-16 ENCOUNTER — Ambulatory Visit: Payer: Self-pay | Admitting: Oncology

## 2010-12-02 ENCOUNTER — Other Ambulatory Visit: Payer: Self-pay

## 2010-12-02 MED ORDER — WARFARIN SODIUM 5 MG PO TABS: 5.0000 mg | ORAL_TABLET | ORAL | Status: DC

## 2010-12-03 ENCOUNTER — Ambulatory Visit (INDEPENDENT_AMBULATORY_CARE_PROVIDER_SITE_OTHER): Payer: Medicare Other | Admitting: Emergency Medicine

## 2010-12-03 DIAGNOSIS — I4891 Unspecified atrial fibrillation: Secondary | ICD-10-CM

## 2010-12-03 DIAGNOSIS — Z7901 Long term (current) use of anticoagulants: Secondary | ICD-10-CM

## 2010-12-03 LAB — POCT INR: INR: 2.3

## 2010-12-09 NOTE — Progress Notes (Signed)
[name], I am having trouble closing two INR encounters. 1st on this patient from 4/18. It tells me I have to enter the INR value? 2nd on Billy Kane on 11/26/2010. Thanks [employer]

## 2010-12-16 ENCOUNTER — Ambulatory Visit: Payer: Self-pay | Admitting: Oncology

## 2010-12-31 ENCOUNTER — Ambulatory Visit (INDEPENDENT_AMBULATORY_CARE_PROVIDER_SITE_OTHER): Payer: Medicare Other | Admitting: Emergency Medicine

## 2010-12-31 DIAGNOSIS — Z7901 Long term (current) use of anticoagulants: Secondary | ICD-10-CM

## 2010-12-31 DIAGNOSIS — I4891 Unspecified atrial fibrillation: Secondary | ICD-10-CM

## 2011-01-16 ENCOUNTER — Ambulatory Visit: Payer: Self-pay | Admitting: Oncology

## 2011-01-21 ENCOUNTER — Ambulatory Visit (INDEPENDENT_AMBULATORY_CARE_PROVIDER_SITE_OTHER): Payer: Medicare Other | Admitting: Emergency Medicine

## 2011-01-21 DIAGNOSIS — Z7901 Long term (current) use of anticoagulants: Secondary | ICD-10-CM

## 2011-01-21 DIAGNOSIS — I4891 Unspecified atrial fibrillation: Secondary | ICD-10-CM

## 2011-02-04 ENCOUNTER — Encounter: Payer: Medicare Other | Admitting: Emergency Medicine

## 2011-02-11 ENCOUNTER — Ambulatory Visit (INDEPENDENT_AMBULATORY_CARE_PROVIDER_SITE_OTHER): Payer: Medicare Other | Admitting: Emergency Medicine

## 2011-02-11 DIAGNOSIS — I4891 Unspecified atrial fibrillation: Secondary | ICD-10-CM

## 2011-02-11 DIAGNOSIS — Z7901 Long term (current) use of anticoagulants: Secondary | ICD-10-CM

## 2011-02-11 LAB — POCT INR: INR: 1.7

## 2011-02-15 ENCOUNTER — Ambulatory Visit: Payer: Self-pay | Admitting: Oncology

## 2011-02-25 ENCOUNTER — Ambulatory Visit (INDEPENDENT_AMBULATORY_CARE_PROVIDER_SITE_OTHER): Payer: Medicare Other | Admitting: Emergency Medicine

## 2011-02-25 DIAGNOSIS — I4891 Unspecified atrial fibrillation: Secondary | ICD-10-CM

## 2011-02-25 DIAGNOSIS — Z7901 Long term (current) use of anticoagulants: Secondary | ICD-10-CM

## 2011-02-25 MED ORDER — WARFARIN SODIUM 5 MG PO TABS: ORAL_TABLET | ORAL | Status: DC

## 2011-03-18 ENCOUNTER — Ambulatory Visit: Payer: Self-pay | Admitting: Oncology

## 2011-03-18 ENCOUNTER — Ambulatory Visit (INDEPENDENT_AMBULATORY_CARE_PROVIDER_SITE_OTHER): Payer: Medicare Other | Admitting: Emergency Medicine

## 2011-03-18 DIAGNOSIS — I4891 Unspecified atrial fibrillation: Secondary | ICD-10-CM

## 2011-03-18 DIAGNOSIS — Z7901 Long term (current) use of anticoagulants: Secondary | ICD-10-CM

## 2011-04-15 ENCOUNTER — Telehealth: Payer: Self-pay | Admitting: Cardiology

## 2011-04-15 ENCOUNTER — Ambulatory Visit (INDEPENDENT_AMBULATORY_CARE_PROVIDER_SITE_OTHER): Payer: Medicare Other | Admitting: Emergency Medicine

## 2011-04-15 DIAGNOSIS — I4891 Unspecified atrial fibrillation: Secondary | ICD-10-CM

## 2011-04-15 DIAGNOSIS — Z7901 Long term (current) use of anticoagulants: Secondary | ICD-10-CM

## 2011-04-15 LAB — POCT INR: INR: 3

## 2011-04-15 NOTE — Telephone Encounter (Signed)
Dr. Heywood Footman office called and patient is having urological surgery on Sept. 12 and they would like him to stop his aspirin, plavix, coumadin 1 week prior to surgery.  Dr. Lonna Cobb would like to know if this is ok?

## 2011-04-17 NOTE — Telephone Encounter (Signed)
Pt son is aware okay to stop Coumadin and Aspirin next Wednesday, per Dr. Daleen Squibb, prior to urological surgery with Dr. Lonna Cobb. Mylo Red RN

## 2011-04-21 NOTE — Telephone Encounter (Signed)
Dr. Eugenie Filler office aware. Info faxed to their office. Mylo Red RN

## 2011-04-24 ENCOUNTER — Other Ambulatory Visit: Payer: Self-pay

## 2011-04-24 ENCOUNTER — Other Ambulatory Visit (INDEPENDENT_AMBULATORY_CARE_PROVIDER_SITE_OTHER): Payer: Medicare Other | Admitting: *Deleted

## 2011-04-24 ENCOUNTER — Ambulatory Visit: Payer: Self-pay | Admitting: Oncology

## 2011-04-24 DIAGNOSIS — I10 Essential (primary) hypertension: Secondary | ICD-10-CM

## 2011-04-24 DIAGNOSIS — Z01818 Encounter for other preprocedural examination: Secondary | ICD-10-CM

## 2011-04-24 LAB — CBC WITH DIFFERENTIAL/PLATELET
Basophils Absolute: 0 10*3/uL (ref 0.0–0.1)
HCT: 36 % — ABNORMAL LOW (ref 39.0–52.0)
Lymphs Abs: 3.1 10*3/uL (ref 0.7–4.0)
Monocytes Absolute: 1.2 10*3/uL — ABNORMAL HIGH (ref 0.1–1.0)
Monocytes Relative: 11.4 % (ref 3.0–12.0)
Neutrophils Relative %: 55.5 % (ref 43.0–77.0)
Platelets: 181 10*3/uL (ref 150.0–400.0)
RDW: 14.1 % (ref 11.5–14.6)

## 2011-04-24 LAB — HEPATIC FUNCTION PANEL
Alkaline Phosphatase: 51 U/L (ref 39–117)
Bilirubin, Direct: 0 mg/dL (ref 0.0–0.3)

## 2011-04-24 LAB — TSH: TSH: 1.3 u[IU]/mL (ref 0.35–5.50)

## 2011-04-24 LAB — BASIC METABOLIC PANEL
CO2: 31 mEq/L (ref 19–32)
Calcium: 9.1 mg/dL (ref 8.4–10.5)
GFR: 46.03 mL/min — ABNORMAL LOW (ref >60.00)
Sodium: 140 mEq/L (ref 135–145)

## 2011-04-27 ENCOUNTER — Ambulatory Visit (INDEPENDENT_AMBULATORY_CARE_PROVIDER_SITE_OTHER): Payer: Medicare Other | Admitting: Internal Medicine

## 2011-04-27 ENCOUNTER — Encounter: Payer: Self-pay | Admitting: Internal Medicine

## 2011-04-27 DIAGNOSIS — N433 Hydrocele, unspecified: Secondary | ICD-10-CM

## 2011-04-27 DIAGNOSIS — I951 Orthostatic hypotension: Secondary | ICD-10-CM

## 2011-04-27 DIAGNOSIS — R0989 Other specified symptoms and signs involving the circulatory and respiratory systems: Secondary | ICD-10-CM

## 2011-04-27 DIAGNOSIS — G903 Multi-system degeneration of the autonomic nervous system: Secondary | ICD-10-CM

## 2011-04-27 DIAGNOSIS — I4891 Unspecified atrial fibrillation: Secondary | ICD-10-CM

## 2011-04-27 DIAGNOSIS — G238 Other specified degenerative diseases of basal ganglia: Secondary | ICD-10-CM

## 2011-04-27 NOTE — Progress Notes (Signed)
Subjective:    HPI   Review of Systems     Objective:   Physical Exam        Assessment & Plan:    Subjective:    Billy Kane is a 77 y.o. male who presents to the office today for a preoperative consultation at the request of Dr. Lonna Cobb who plans on performing hydrocele correctionSeptember 12. This consultation is requested for the specific conditions prompting preoperative evaluation (i.e. because of potential affect on operative risk):  Atrial fibrillation, CAD. Planned anesthesia: epidural. The patient has the following known anesthesia issues: none. Patients bleeding risk: no recent abnormal bleeding. Patient does not have objections to receiving blood products if needed.  The following portions of the patient's history were reviewed and updated as appropriate: allergies, current medications, past family history, past medical history, past social history, past surgical history and problem list.  Past Medical History  Diagnosis Date  . CAD (coronary artery disease)     s/p AMI 1998  . Hyperlipidemia   . Lymphocytic leukemia 12/10    small, diagnosed 12/10  . Multiple myeloma     diagnosed with monclonal spike fo 5 gm  . Hypertension   . S/P coronary artery bypass graft x 4 1998    [facility], SVG to distal RCA, OM1 D1; LIMA to distal LAD   No current outpatient prescriptions on file prior to visit.    Review of Systems A comprehensive review of systems was negative.    Objective:    BP 160/89  Pulse 68  Temp(Src) 97.7 F (36.5 C) (Oral)  Resp 16  Ht 5\' 6"  (1.676 m)  Wt 195 lb (88.451 kg)  BMI 31.47 kg/m2  SpO2 96% General appearance: alert, cooperative and appears stated age Head: Normocephalic, without obvious abnormality, atraumatic Eyes: conjunctivae/corneas clear. PERRL, EOM's intact. Fundi benign. Nose: Nares normal. Septum midline. Mucosa normal. No drainage or sinus tenderness. Throat: lips, mucosa, and tongue normal; teeth and gums normal Neck: no  adenopathy, no carotid bruit, no JVD, supple, symmetrical, trachea midline and thyroid not enlarged, symmetric, no tenderness/mass/nodules Back: symmetric, no curvature. ROM normal. No CVA tenderness. Lungs: rales base - right Chest wall: no tenderness Heart: regular rate and rhythm, S1, S2 normal, no murmur, click, rub or gallop Abdomen: soft, non-tender; bowel sounds normal; no masses,  no organomegaly Extremities: extremities normal, atraumatic, no cyanosis or edema Pulses: 2+ and symmetric Skin: Skin color, texture, turgor normal. No rashes or lesions Lymph nodes: Cervical, supraclavicular, and axillary nodes normal. Neurologic: Alert and oriented X 3, normal strength and tone. Normal symmetric reflexes. Normal coordination and gait  Predictors of intubation difficulty:  Morbid obesity? no  Anatomically abnormal facies? no  Prominent incisors? no  Receding mandible? no  Short, thick neck? no  Neck range of motion:  Dentition: No chipped, loose, or missing teeth.  Cardiographics ECG: 1st degree heart block   Imaging Chest x-ray: pending  Lab Review  Lab on 04/24/2011  Component Date Value  . TSH 04/24/2011 1.30   . WBC 04/24/2011 10.3   . RBC 04/24/2011 4.06*  . Hemoglobin 04/24/2011 11.9*  . HCT 04/24/2011 36.0*  . MCV 04/24/2011 88.7   . MCHC 04/24/2011 33.1   . RDW 04/24/2011 14.1   . Platelets 04/24/2011 181.0   . Neutrophils Relative 04/24/2011 55.5   . Lymphocytes Relative 04/24/2011 29.9   . Monocytes Relative 04/24/2011 11.4   . Eosinophils Relative 04/24/2011 2.8   . Basophils Relative 04/24/2011 0.4   .  Neutro Abs 04/24/2011 5.7   . Lymphs Abs 04/24/2011 3.1   . Monocytes Absolute 04/24/2011 1.2*  . Eosinophils Absolute 04/24/2011 0.3   . Basophils Absolute 04/24/2011 0.0   . Sodium 04/24/2011 140   . Potassium 04/24/2011 4.5   . Chloride 04/24/2011 104   . CO2 04/24/2011 31   . Glucose, Bld 04/24/2011 102*  . BUN 04/24/2011 21   . Creatinine, Ser  04/24/2011 1.5   . Calcium 04/24/2011 9.1   . GFR 04/24/2011 46.03*  . Total Bilirubin 04/24/2011 0.7   . Bilirubin, Direct 04/24/2011 0.0   . Alkaline Phosphatase 04/24/2011 51   . AST 04/24/2011 22   . ALT 04/24/2011 17   . Total Protein 04/24/2011 6.6   . Albumin 04/24/2011 3.9       Assessment:      77 y.o. male with planned surgery as above.   Known risk factors for perioperative complications: Coronary disease   Difficulty with intubation is not anticipated.  Cardiac Risk Estimation: moderate  Current medications which may produce withdrawal symptoms if withheld perioperatively: none    Plan:    1. Preoperative workup as follows chest xray, EKG. 2. Change in medication regimen before surgery: discontinue antihypertensives (amlodipine) to avoid hypotension from epidural anesthesia. 3. Prophylaxis for cardiac events with perioperative beta-blockers: should be considered, specific regimen per anesthesia. 4. Invasive hemodynamic monitoring perioperatively: not indicated. 5. Deep vein thrombosis prophylaxis postoperatively:pharmacologic prophylaxis (with any of the following: enoxaparin (Lovenox) 40mg  SQ 2 hours prior to surgery then every day). 6. Surveillance for postoperative MI with ECG immediately postoperatively and on postoperative days 1 and 2 AND troponin levels 24 hours postoperatively and on day 4 or hospital discharge (whichever comes first): strongly advised.

## 2011-04-27 NOTE — Patient Instructions (Addendum)
I have ordered a chest x ray to evaluate  Your lungs prior to surgery.  Please get that done tonight .   No changes were made to any of your medications currently.  Return in 6 months, sooner if needed

## 2011-04-28 ENCOUNTER — Other Ambulatory Visit: Payer: Self-pay | Admitting: *Deleted

## 2011-04-28 ENCOUNTER — Encounter: Payer: Self-pay | Admitting: Internal Medicine

## 2011-04-28 ENCOUNTER — Other Ambulatory Visit: Payer: Self-pay | Admitting: Cardiology

## 2011-04-28 ENCOUNTER — Ambulatory Visit: Payer: Self-pay | Admitting: Internal Medicine

## 2011-04-28 DIAGNOSIS — N401 Enlarged prostate with lower urinary tract symptoms: Secondary | ICD-10-CM

## 2011-04-28 MED ORDER — FINASTERIDE 5 MG PO TABS: 5.0000 mg | ORAL_TABLET | Freq: Every day | ORAL | Status: DC

## 2011-04-28 NOTE — Telephone Encounter (Signed)
Received faxed refill request from pharmacy. Is it okay to refill Finasteride?

## 2011-04-29 ENCOUNTER — Ambulatory Visit: Payer: Self-pay | Admitting: Urology

## 2011-05-04 LAB — PATHOLOGY REPORT

## 2011-05-06 ENCOUNTER — Ambulatory Visit (INDEPENDENT_AMBULATORY_CARE_PROVIDER_SITE_OTHER): Payer: Medicare Other | Admitting: Emergency Medicine

## 2011-05-06 DIAGNOSIS — I4891 Unspecified atrial fibrillation: Secondary | ICD-10-CM

## 2011-05-06 DIAGNOSIS — Z7901 Long term (current) use of anticoagulants: Secondary | ICD-10-CM

## 2011-05-11 ENCOUNTER — Encounter: Payer: Self-pay | Admitting: Internal Medicine

## 2011-05-18 ENCOUNTER — Ambulatory Visit: Payer: Self-pay | Admitting: Oncology

## 2011-06-03 ENCOUNTER — Ambulatory Visit (INDEPENDENT_AMBULATORY_CARE_PROVIDER_SITE_OTHER): Payer: Medicare Other | Admitting: Emergency Medicine

## 2011-06-03 DIAGNOSIS — Z7901 Long term (current) use of anticoagulants: Secondary | ICD-10-CM

## 2011-06-03 DIAGNOSIS — I4891 Unspecified atrial fibrillation: Secondary | ICD-10-CM

## 2011-06-03 LAB — POCT INR: INR: 2.7

## 2011-06-10 ENCOUNTER — Ambulatory Visit (INDEPENDENT_AMBULATORY_CARE_PROVIDER_SITE_OTHER): Payer: Medicare Other | Admitting: Internal Medicine

## 2011-06-10 ENCOUNTER — Encounter: Payer: Self-pay | Admitting: Internal Medicine

## 2011-06-10 VITALS — BP 146/60 | HR 69 | Temp 97.8°F | Resp 16 | Wt 169.5 lb

## 2011-06-10 DIAGNOSIS — J069 Acute upper respiratory infection, unspecified: Secondary | ICD-10-CM

## 2011-06-10 DIAGNOSIS — C9000 Multiple myeloma not having achieved remission: Secondary | ICD-10-CM

## 2011-06-10 DIAGNOSIS — I4891 Unspecified atrial fibrillation: Secondary | ICD-10-CM

## 2011-06-10 MED ORDER — AMOXICILLIN-POT CLAVULANATE 875-125 MG PO TABS: 1.0000 | ORAL_TABLET | Freq: Two times a day (BID) | ORAL | Status: AC

## 2011-06-10 MED ORDER — HYDROCODONE-ACETAMINOPHEN 5-325 MG PO TABS: 1.0000 | ORAL_TABLET | Freq: Four times a day (QID) | ORAL | Status: AC | PRN

## 2011-06-10 MED ORDER — METHYLPREDNISOLONE ACETATE 40 MG/ML IJ SUSP
40.0000 mg | Freq: Once | INTRAMUSCULAR | Status: AC
  Administered 2011-06-10: 40 mg via INTRAMUSCULAR

## 2011-06-10 MED ORDER — PREDNISONE (PAK) 10 MG PO TABS: ORAL_TABLET | ORAL | Status: AC

## 2011-06-10 NOTE — Patient Instructions (Signed)
I am prescribing Augmentin, prednisone and hydrocodone to treat your respiratory infection and your cough and wheezing.   Please call if you don't feel better in 48 hours

## 2011-06-14 ENCOUNTER — Encounter: Payer: Self-pay | Admitting: Internal Medicine

## 2011-06-14 DIAGNOSIS — I10 Essential (primary) hypertension: Secondary | ICD-10-CM | POA: Insufficient documentation

## 2011-06-14 DIAGNOSIS — C9000 Multiple myeloma not having achieved remission: Secondary | ICD-10-CM | POA: Insufficient documentation

## 2011-06-14 DIAGNOSIS — K279 Peptic ulcer, site unspecified, unspecified as acute or chronic, without hemorrhage or perforation: Secondary | ICD-10-CM | POA: Insufficient documentation

## 2011-06-14 DIAGNOSIS — E785 Hyperlipidemia, unspecified: Secondary | ICD-10-CM | POA: Insufficient documentation

## 2011-06-14 DIAGNOSIS — N189 Chronic kidney disease, unspecified: Secondary | ICD-10-CM | POA: Insufficient documentation

## 2011-06-14 NOTE — Assessment & Plan Note (Signed)
Avoiding bronchodilators to treat mild  wheezing given concern for tachycardia

## 2011-06-14 NOTE — Progress Notes (Signed)
Subjective:    Patient ID: Billy Kane, male    DOB: [birth year], 77 y.o.   MRN: [id number]  HPI  37 you white male with multiple myeloma, treated with Cytoxan and Zometa by Dr. Doylene Canning, Stage III Kidney disease secondary to MM, Htn, CAD s/p bypass, BPH, mild dementia, recent dx of atrial fibrilation now on chronic anticoagulation, presents with cough and sinus congestion x 4 days.  Accompanied by son and caregiver. Denies dyspnea but noted to be wheezing by caregiver.  No documented fevers or puruent sputum. No recent travel. Past Medical History  Diagnosis Date  . Coronary artery disease   . PUD (peptic ulcer disease)   . Depression   . TIA (transient ischemic attack)   . CAD (coronary artery disease)     s/p AMI 1998  . Hyperlipidemia   . Lymphocytic leukemia 12/10    small, diagnosed 12/10  . Multiple myeloma     diagnosed with monclonal spike fo 5 gm  . Hypertension   . S/P coronary artery bypass graft x 4 1998    [facility], SVG to distal RCA, OM1 D1; LIMA to distal LAD  . Chronic kidney disease     Stage III, secondary to myeloma   Current Outpatient Prescriptions on File Prior to Visit  Medication Sig Dispense Refill  . acetaminophen (TYLENOL) 500 MG tablet Take 500 mg by mouth 2 (two) times daily.        Marland Kitchen amLODipine (NORVASC) 10 MG tablet Take 10 mg by mouth daily.        Marland Kitchen aspirin 81 MG EC tablet Take 81 mg by mouth daily.        Marland Kitchen atorvastatin (LIPITOR) 20 MG tablet Take 20 mg by mouth at bedtime.        . budesonide-formoterol (SYMBICORT) 160-4.5 MCG/ACT inhaler Inhale 1 puff into the lungs 2 (two) times daily as needed.        [name] Sodium (STOOL SOFTENER) 100 MG capsule Take 100 mg by mouth 2 (two) times daily as needed.        . donepezil (ARICEPT) 5 MG tablet Take 5 mg by mouth daily.        Marland Kitchen escitalopram (LEXAPRO) 20 MG tablet Take 20 mg by mouth daily.        . finasteride (PROSCAR) 5 MG tablet Take 1 tablet (5 mg total) by mouth daily.  30 tablet  6  .  Lenalidomide (REVLIMID) 5 MG CAPS Take 1 capsule by mouth as directed. 1 capsule for 21 days then 7 days off then repeat       . methocarbamol (ROBAXIN) 500 MG tablet Take 1/2 to 1 tablet by mouth every 12 hours, as needed       . metoprolol tartrate (LOPRESSOR) 25 MG tablet Take 12.5 mg by mouth 2 (two) times daily.        . Tamsulosin HCl (FLOMAX) 0.4 MG CAPS Take by mouth daily.        Marland Kitchen warfarin (COUMADIN) 5 MG tablet Take as directed by Anticoagulation clinic  50 tablet  3  . warfarin (COUMADIN) 5 MG tablet TAKE 1 TABLET AS DIRECTED  45 tablet  3    Review of Systems  Constitutional: Negative for fever, chills, diaphoresis, activity change, appetite change, fatigue and unexpected weight change.  HENT: Positive for congestion, sore throat, rhinorrhea, dental problem, postnasal drip and sinus pressure. Negative for hearing loss, ear pain, nosebleeds, facial swelling, sneezing, drooling, mouth sores, trouble swallowing, neck  pain, neck stiffness, voice change, tinnitus and ear discharge.   Eyes: Negative for photophobia, pain, discharge, redness, itching and visual disturbance.  Respiratory: Positive for cough and wheezing. Negative for apnea, choking, chest tightness, shortness of breath and stridor.   Cardiovascular: Positive for leg swelling. Negative for chest pain and palpitations.  Gastrointestinal: Negative for nausea, vomiting, abdominal pain, diarrhea, constipation, blood in stool, abdominal distention, anal bleeding and rectal pain.  Genitourinary: Negative for dysuria, urgency, frequency, hematuria, flank pain, decreased urine volume, scrotal swelling, difficulty urinating and testicular pain.  Musculoskeletal: Negative for myalgias, back pain, joint swelling, arthralgias and gait problem.  Skin: Negative for color change, rash and wound.  Neurological: Negative for dizziness, tremors, seizures, syncope, speech difficulty, weakness, light-headedness, numbness and headaches.    Psychiatric/Behavioral: Negative for suicidal ideas, hallucinations, behavioral problems, confusion, sleep disturbance, dysphoric mood, decreased concentration and agitation. The patient is not nervous/anxious.        Objective:   Physical Exam  Constitutional: He is oriented to person, place, and time.  HENT:  Head: Normocephalic and atraumatic.  Right Ear: No mastoid tenderness. Tympanic membrane is injected.  Left Ear: No mastoid tenderness. Tympanic membrane is not bulging.  Mouth/Throat: Uvula is midline and mucous membranes are normal. Posterior oropharyngeal erythema present. No oropharyngeal exudate.  Eyes: Conjunctivae and EOM are normal.  Neck: Normal range of motion. Neck supple. No JVD present. No thyromegaly present.  Cardiovascular: Normal rate, regular rhythm and normal heart sounds.   Pulmonary/Chest: Effort normal. No respiratory distress. He has decreased breath sounds. He has wheezes. He has no rales.  Abdominal: Soft. Bowel sounds are normal. He exhibits no mass. There is no tenderness. There is no rebound.  Musculoskeletal: Normal range of motion. He exhibits no edema.  Neurological: He is alert and oriented to person, place, and time.  Skin: Skin is warm and dry.  Psychiatric: He has a normal mood and affect.          Assessment & Plan:  Uppre respiratory infection.  Given his immunocompromised state will treat with empiric abx, steroids for wheezing,  And hydrocodone for cough. Return if no immporvement in 48 hours.

## 2011-06-15 ENCOUNTER — Other Ambulatory Visit: Payer: Self-pay | Admitting: Internal Medicine

## 2011-06-22 ENCOUNTER — Other Ambulatory Visit: Payer: Self-pay | Admitting: Internal Medicine

## 2011-06-23 ENCOUNTER — Ambulatory Visit: Payer: Self-pay | Admitting: Oncology

## 2011-06-24 MED ORDER — DONEPEZIL HCL 5 MG PO TABS: 5.0000 mg | ORAL_TABLET | Freq: Every day | ORAL | Status: DC

## 2011-06-25 ENCOUNTER — Encounter: Payer: Self-pay | Admitting: Internal Medicine

## 2011-06-30 ENCOUNTER — Ambulatory Visit: Payer: Self-pay | Admitting: Internal Medicine

## 2011-06-30 ENCOUNTER — Other Ambulatory Visit: Payer: Self-pay | Admitting: *Deleted

## 2011-06-30 DIAGNOSIS — R05 Cough: Secondary | ICD-10-CM

## 2011-06-30 MED ORDER — LEVOFLOXACIN 750 MG PO TABS: 750.0000 mg | ORAL_TABLET | Freq: Every day | ORAL | Status: DC

## 2011-06-30 NOTE — Telephone Encounter (Signed)
Chest xray req printed for [facility],  rx for Levaquin antibiotic in EPIC.  Please call to pharmacy,  One tablet daily. For 7 days.  Give him an appt for Friday if possible

## 2011-06-30 NOTE — Telephone Encounter (Signed)
Patient's son says that patient has developed a really deep cough, having some wheezing. Not having any other symptoms. Son doesn't want this to turn into pneumonia because he is already so fragile. Son is asking if he could get an antibiotic and is also asking if he should have chest xray done.

## 2011-06-30 NOTE — Telephone Encounter (Signed)
Patient's son notified, rx for Levaquin called in. Patient has appt scheduled for Monday. Also son is asking if he can get something called in for his cough.

## 2011-07-01 ENCOUNTER — Encounter: Payer: Medicare Other | Admitting: Emergency Medicine

## 2011-07-01 ENCOUNTER — Encounter: Payer: Self-pay | Admitting: Emergency Medicine

## 2011-07-01 MED ORDER — LEVOFLOXACIN 750 MG PO TABS: 750.0000 mg | ORAL_TABLET | Freq: Every day | ORAL | Status: AC

## 2011-07-01 MED ORDER — BENZONATATE 200 MG PO CAPS: 200.0000 mg | ORAL_CAPSULE | Freq: Three times a day (TID) | ORAL | Status: AC | PRN

## 2011-07-01 NOTE — Telephone Encounter (Signed)
I did not see the request for cough.  I have sent an rx for tessalon 200 mg one capsule every 8 hours prn cough to his pharmacy.   A Daily dose of prednisone will not prevent pneumonia,  It will only weaken his immune system and his bones and make him more susceptible to infection and fractures.

## 2011-07-01 NOTE — Telephone Encounter (Signed)
Patient's son called asking why cough syrup has not been called in. I explained to him that I would send you the note again incase it wasn't routed correctly the first time. Also he is now asking if his dad could be put on a low dose of prednisone through the winter to prevent him from getting pneumonia and hopefully will keep him out of the hospital. Please advise.

## 2011-07-02 NOTE — Telephone Encounter (Signed)
Patient's son notified.

## 2011-07-03 ENCOUNTER — Telehealth: Payer: Self-pay | Admitting: Internal Medicine

## 2011-07-03 NOTE — Telephone Encounter (Signed)
Chest xray was normal.,  No pneumonia

## 2011-07-03 NOTE — Telephone Encounter (Signed)
Patient's daughter notified.

## 2011-07-06 ENCOUNTER — Encounter: Payer: Self-pay | Admitting: Internal Medicine

## 2011-07-06 ENCOUNTER — Ambulatory Visit (INDEPENDENT_AMBULATORY_CARE_PROVIDER_SITE_OTHER): Payer: Medicare Other | Admitting: Internal Medicine

## 2011-07-06 VITALS — BP 108/58 | HR 74 | Temp 97.5°F | Resp 16 | Ht 66.0 in | Wt 190.0 lb

## 2011-07-06 DIAGNOSIS — J40 Bronchitis, not specified as acute or chronic: Secondary | ICD-10-CM

## 2011-07-06 MED ORDER — BUDESONIDE 180 MCG/ACT IN AEPB: 2.0000 | INHALATION_SPRAY | Freq: Two times a day (BID) | RESPIRATORY_TRACT | Status: DC

## 2011-07-06 MED ORDER — PREDNISONE (PAK) 10 MG PO TABS: ORAL_TABLET | ORAL | Status: AC

## 2011-07-06 MED ORDER — LEVALBUTEROL TARTRATE 45 MCG/ACT IN AERO: 1.0000 | INHALATION_SPRAY | RESPIRATORY_TRACT | Status: DC | PRN

## 2011-07-06 MED ORDER — METHYLPREDNISOLONE ACETATE PF 40 MG/ML IJ SUSP
40.0000 mg | Freq: Once | INTRAMUSCULAR | Status: AC
  Administered 2011-07-06: 40 mg via INTRAMUSCULAR

## 2011-07-06 NOTE — Progress Notes (Signed)
Subjective:    Patient ID: Billy Kane, male    DOB: 1922-08-23, 77 y.o.   MRN: [id number]  HPIMr. Kane is an 77 yo white male with a istory of multiple myeloma, dementia ,  Atrial fibrilllaiton last seeno on Oct 24 and treated for URI with augmentin who  returns for followup on bronchitis symptoms wich recurred last week with cough and malaise.  He was treated for pneumonia with 7 day course of levaquin and cough suppressants,  And his cxr was nornmal.  He is wheezing today and coughing quite a bit.  Had a minor fall yesterday due to loss of balance.  Past Medical History  Diagnosis Date  . Coronary artery disease   . Depression   . TIA (transient ischemic attack)   . CAD (coronary artery disease)     s/p AMI 1998  . Lymphocytic leukemia 12/10    small, diagnosed 12/10  . Multiple myeloma     diagnosed with monclonal spike fo 5 gm  . S/P coronary artery bypass graft x 4 1998    [facility], SVG to distal RCA, OM1 D1; LIMA to distal LAD  . Hypertension   . Hyperlipidemia   . Chronic kidney disease     Stage III, secondary to myeloma  . PUD (peptic ulcer disease)    Current Outpatient Prescriptions on File Prior to Visit  Medication Sig Dispense Refill  . acetaminophen (TYLENOL) 500 MG tablet Take 500 mg by mouth 2 (two) times daily.        Marland Kitchen amLODipine (NORVASC) 10 MG tablet Take 10 mg by mouth daily.        Marland Kitchen aspirin 81 MG EC tablet Take 81 mg by mouth daily.        Marland Kitchen atorvastatin (LIPITOR) 20 MG tablet Take 20 mg by mouth at bedtime.        . benzonatate (TESSALON) 200 MG capsule Take 1 capsule (200 mg total) by mouth 3 (three) times daily as needed for cough.  60 capsule  0  . budesonide-formoterol (SYMBICORT) 160-4.5 MCG/ACT inhaler Inhale 1 puff into the lungs 2 (two) times daily as needed.        [name] Sodium (STOOL SOFTENER) 100 MG capsule Take 100 mg by mouth 2 (two) times daily as needed.        . donepezil (ARICEPT) 5 MG tablet Take 1 tablet (5 mg total) by mouth daily.  30  tablet  3  . escitalopram (LEXAPRO) 20 MG tablet TAKE 1 TABLET EVERY DAY  30 tablet  5  . finasteride (PROSCAR) 5 MG tablet Take 1 tablet (5 mg total) by mouth daily.  30 tablet  6  . HYDROcodone-acetaminophen (NORCO) 5-325 MG per tablet Take 1 tablet by mouth every 6 (six) hours as needed for pain (or cough).  40 tablet  0  . Lenalidomide (REVLIMID) 5 MG CAPS Take 1 capsule by mouth as directed. 1 capsule for 21 days then 7 days off then repeat       . levofloxacin (LEVAQUIN) 750 MG tablet Take 1 tablet (750 mg total) by mouth daily.  7 tablet  0  . methocarbamol (ROBAXIN) 500 MG tablet Take 1/2 to 1 tablet by mouth every 12 hours, as needed       . metoprolol tartrate (LOPRESSOR) 25 MG tablet Take 12.5 mg by mouth 2 (two) times daily.        . Tamsulosin HCl (FLOMAX) 0.4 MG CAPS Take by mouth daily.        Marland Kitchen  warfarin (COUMADIN) 5 MG tablet Take as directed by Anticoagulation clinic  50 tablet  3   No current facility-administered medications on file prior to visit.    Review of Systems  Constitutional: Positive for appetite change. Negative for fever, chills, diaphoresis, activity change, fatigue and unexpected weight change.  HENT: Negative for hearing loss, ear pain, nosebleeds, congestion, sore throat, facial swelling, rhinorrhea, sneezing, drooling, mouth sores, trouble swallowing, neck pain, neck stiffness, dental problem, voice change, postnasal drip, sinus pressure, tinnitus and ear discharge.   Eyes: Negative for photophobia, pain, discharge, redness, itching and visual disturbance.  Respiratory: Positive for cough and wheezing. Negative for apnea, choking, chest tightness, shortness of breath and stridor.   Cardiovascular: Negative for chest pain, palpitations and leg swelling.  Gastrointestinal: Negative for nausea, vomiting, abdominal pain, diarrhea, constipation, blood in stool, abdominal distention, anal bleeding and rectal pain.  Genitourinary: Negative for dysuria, urgency,  frequency, hematuria, flank pain, decreased urine volume, scrotal swelling, difficulty urinating and testicular pain.  Musculoskeletal: Negative for myalgias, back pain, joint swelling, arthralgias and gait problem.  Skin: Negative for color change, rash and wound.  Neurological: Positive for dizziness. Negative for tremors, seizures, syncope, speech difficulty, weakness, light-headedness, numbness and headaches.  Psychiatric/Behavioral: Negative for suicidal ideas, hallucinations, behavioral problems, confusion, sleep disturbance, dysphoric mood, decreased concentration and agitation. The patient is not nervous/anxious.    BP 108/58  Pulse 74  Temp(Src) 97.5 F (36.4 C) (Oral)  Resp 16  Ht 5\' 6"  (1.676 m)  Wt 190 lb (86.183 kg)  BMI 30.67 kg/m2  SpO2 97%     Objective:   Physical Exam  Constitutional: He is oriented to person, place, and time. He appears well-developed and well-nourished.  HENT:  Head: Normocephalic and atraumatic.  Right Ear: Tympanic membrane normal.  Left Ear: Tympanic membrane normal. No mastoid tenderness. Tympanic membrane is not bulging.  Mouth/Throat: Uvula is midline and mucous membranes are normal. No oropharyngeal exudate, posterior oropharyngeal edema or posterior oropharyngeal erythema.  Eyes: Conjunctivae and EOM are normal.  Neck: Normal range of motion. Neck supple. No JVD present. No thyromegaly present.  Cardiovascular: Normal rate, regular rhythm and normal heart sounds.   Pulmonary/Chest: Effort normal. No respiratory distress. He has decreased breath sounds. He has wheezes. He has no rales.  Abdominal: Soft. Bowel sounds are normal. He exhibits no mass. There is no tenderness. There is no rebound.  Musculoskeletal: Normal range of motion. He exhibits no edema.  Neurological: He is alert and oriented to person, place, and time.  Skin: Skin is warm and dry.  Psychiatric: He has a normal mood and affect.          Assessment & Plan:

## 2011-07-06 NOTE — Assessment & Plan Note (Addendum)
Secondary to viral syndrome most likely given  augmentin, followed by levaquin and normal cxr.  Steroids given with IM dose,  12 dayoral  taper and pulmicort inhaler and  xopenex inhalers added with available samples  Treat cough with tessalon, mucinesx and Delsy, for daytime and add vicodin at night.

## 2011-07-06 NOTE — Patient Instructions (Signed)
Use the mucinex to thin out the secretions as directed on bottle.  Take the benzonatate capsules 200 mg every 8 hours  Add deslym in between the benonatate    Save the vicodin for nighttime, unless the other meds are not helping the daytime cough.     Use the xopenex as a short acting bronchodilator 1 puff every 6 hours..  Use the pulmicort (stoeird inhaler) 2 puff every 12 hours.    Prednisone taper over 12 days  (60 mg daily for 2 days then 50 mg daily for 2 days,  Then 40 mg etc) until gone

## 2011-07-08 ENCOUNTER — Ambulatory Visit (INDEPENDENT_AMBULATORY_CARE_PROVIDER_SITE_OTHER): Payer: Medicare Other | Admitting: Emergency Medicine

## 2011-07-08 DIAGNOSIS — I4891 Unspecified atrial fibrillation: Secondary | ICD-10-CM

## 2011-07-08 DIAGNOSIS — Z7901 Long term (current) use of anticoagulants: Secondary | ICD-10-CM

## 2011-07-18 ENCOUNTER — Ambulatory Visit: Payer: Self-pay | Admitting: Oncology

## 2011-07-18 ENCOUNTER — Emergency Department: Payer: Self-pay | Admitting: Emergency Medicine

## 2011-07-22 ENCOUNTER — Ambulatory Visit (INDEPENDENT_AMBULATORY_CARE_PROVIDER_SITE_OTHER): Payer: Medicare Other | Admitting: Emergency Medicine

## 2011-07-22 ENCOUNTER — Encounter: Payer: Self-pay | Admitting: Internal Medicine

## 2011-07-22 DIAGNOSIS — Z7901 Long term (current) use of anticoagulants: Secondary | ICD-10-CM

## 2011-07-22 DIAGNOSIS — I4891 Unspecified atrial fibrillation: Secondary | ICD-10-CM

## 2011-07-28 ENCOUNTER — Ambulatory Visit: Payer: Self-pay | Admitting: Unknown Physician Specialty

## 2011-08-05 ENCOUNTER — Ambulatory Visit (INDEPENDENT_AMBULATORY_CARE_PROVIDER_SITE_OTHER): Payer: Medicare Other | Admitting: Emergency Medicine

## 2011-08-05 DIAGNOSIS — I4891 Unspecified atrial fibrillation: Secondary | ICD-10-CM

## 2011-08-05 DIAGNOSIS — Z7901 Long term (current) use of anticoagulants: Secondary | ICD-10-CM

## 2011-08-05 LAB — POCT INR: INR: 2.8

## 2011-08-19 ENCOUNTER — Ambulatory Visit (INDEPENDENT_AMBULATORY_CARE_PROVIDER_SITE_OTHER): Payer: Medicare Other | Admitting: Emergency Medicine

## 2011-08-19 ENCOUNTER — Ambulatory Visit: Payer: Self-pay | Admitting: Oncology

## 2011-08-19 DIAGNOSIS — Z7901 Long term (current) use of anticoagulants: Secondary | ICD-10-CM

## 2011-08-19 DIAGNOSIS — I4891 Unspecified atrial fibrillation: Secondary | ICD-10-CM

## 2011-08-19 LAB — CBC CANCER CENTER
Basophil #: 0 x10 3/mm (ref 0.0–0.1)
Eosinophil #: 0.1 x10 3/mm (ref 0.0–0.7)
Lymphocyte #: 3 x10 3/mm (ref 1.0–3.6)
Lymphocyte %: 33.2 %
MCH: 29.7 pg (ref 26.0–34.0)
MCHC: 34 g/dL (ref 32.0–36.0)
MCV: 87 fL (ref 80–100)
Monocyte #: 0.9 x10 3/mm — ABNORMAL HIGH (ref 0.0–0.7)
Platelet: 180 x10 3/mm (ref 150–440)
RDW: 17 % — ABNORMAL HIGH (ref 11.5–14.5)

## 2011-08-19 LAB — COMPREHENSIVE METABOLIC PANEL
Albumin: 3.2 g/dL — ABNORMAL LOW (ref 3.4–5.0)
Alkaline Phosphatase: 58 U/L (ref 50–136)
Calcium, Total: 8.5 mg/dL (ref 8.5–10.1)
Glucose: 102 mg/dL — ABNORMAL HIGH (ref 65–99)
SGOT(AST): 19 U/L (ref 15–37)
SGPT (ALT): 24 U/L

## 2011-08-19 LAB — POCT INR: INR: 2.3

## 2011-08-20 ENCOUNTER — Other Ambulatory Visit: Payer: Self-pay | Admitting: *Deleted

## 2011-08-20 LAB — PROT IMMUNOELECTROPHORES(ARMC)

## 2011-08-20 MED ORDER — ATORVASTATIN CALCIUM 20 MG PO TABS: 20.0000 mg | ORAL_TABLET | Freq: Every day | ORAL | Status: DC

## 2011-08-21 ENCOUNTER — Other Ambulatory Visit: Payer: Self-pay | Admitting: Cardiology

## 2011-09-09 ENCOUNTER — Ambulatory Visit (INDEPENDENT_AMBULATORY_CARE_PROVIDER_SITE_OTHER): Payer: Medicare Other | Admitting: Emergency Medicine

## 2011-09-09 DIAGNOSIS — I4891 Unspecified atrial fibrillation: Secondary | ICD-10-CM

## 2011-09-09 DIAGNOSIS — Z7901 Long term (current) use of anticoagulants: Secondary | ICD-10-CM

## 2011-09-18 ENCOUNTER — Ambulatory Visit: Payer: Self-pay | Admitting: Oncology

## 2011-09-23 ENCOUNTER — Ambulatory Visit (INDEPENDENT_AMBULATORY_CARE_PROVIDER_SITE_OTHER): Payer: Medicare Other | Admitting: Emergency Medicine

## 2011-09-23 DIAGNOSIS — I4891 Unspecified atrial fibrillation: Secondary | ICD-10-CM

## 2011-09-23 DIAGNOSIS — Z7901 Long term (current) use of anticoagulants: Secondary | ICD-10-CM

## 2011-10-07 ENCOUNTER — Ambulatory Visit (INDEPENDENT_AMBULATORY_CARE_PROVIDER_SITE_OTHER): Payer: Medicare Other | Admitting: *Deleted

## 2011-10-07 DIAGNOSIS — Z7901 Long term (current) use of anticoagulants: Secondary | ICD-10-CM

## 2011-10-07 DIAGNOSIS — I4891 Unspecified atrial fibrillation: Secondary | ICD-10-CM

## 2011-10-14 ENCOUNTER — Ambulatory Visit: Payer: Self-pay | Admitting: Cardiology

## 2011-10-14 LAB — CBC CANCER CENTER
Basophil #: 0.1 x10 3/mm (ref 0.0–0.1)
Eosinophil #: 0.2 x10 3/mm (ref 0.0–0.7)
HGB: 12.5 g/dL — ABNORMAL LOW (ref 13.0–18.0)
Lymphocyte #: 3.3 x10 3/mm (ref 1.0–3.6)
Lymphocyte %: 36 %
MCH: 29 pg (ref 26.0–34.0)
MCHC: 33.3 g/dL (ref 32.0–36.0)
MCV: 87 fL (ref 80–100)
Monocyte %: 7.1 %
Neutrophil %: 53.4 %
Platelet: 182 x10 3/mm (ref 150–440)
RDW: 15.2 % — ABNORMAL HIGH (ref 11.5–14.5)
WBC: 9 x10 3/mm (ref 3.8–10.6)

## 2011-10-14 LAB — COMPREHENSIVE METABOLIC PANEL
Albumin: 3.5 g/dL (ref 3.4–5.0)
Anion Gap: 8 (ref 7–16)
Bilirubin,Total: 0.4 mg/dL (ref 0.2–1.0)
Calcium, Total: 8.4 mg/dL — ABNORMAL LOW (ref 8.5–10.1)
Co2: 29 mmol/L (ref 21–32)
Creatinine: 1.56 mg/dL — ABNORMAL HIGH (ref 0.60–1.30)
EGFR (African American): 55 — ABNORMAL LOW
Glucose: 133 mg/dL — ABNORMAL HIGH (ref 65–99)
SGOT(AST): 21 U/L (ref 15–37)

## 2011-10-15 LAB — PROT IMMUNOELECTROPHORES(ARMC)

## 2011-10-16 ENCOUNTER — Ambulatory Visit: Payer: Self-pay | Admitting: Oncology

## 2011-10-20 ENCOUNTER — Other Ambulatory Visit: Payer: Self-pay | Admitting: Internal Medicine

## 2011-10-21 ENCOUNTER — Ambulatory Visit (INDEPENDENT_AMBULATORY_CARE_PROVIDER_SITE_OTHER): Payer: Medicare Other

## 2011-10-21 DIAGNOSIS — Z7901 Long term (current) use of anticoagulants: Secondary | ICD-10-CM

## 2011-10-21 DIAGNOSIS — I4891 Unspecified atrial fibrillation: Secondary | ICD-10-CM

## 2011-10-21 MED ORDER — DONEPEZIL HCL 5 MG PO TABS: 5.0000 mg | ORAL_TABLET | Freq: Every day | ORAL | Status: DC

## 2011-10-26 ENCOUNTER — Encounter: Payer: Self-pay | Admitting: Internal Medicine

## 2011-10-26 ENCOUNTER — Ambulatory Visit (INDEPENDENT_AMBULATORY_CARE_PROVIDER_SITE_OTHER): Payer: Medicare Other | Admitting: Internal Medicine

## 2011-10-26 DIAGNOSIS — E669 Obesity, unspecified: Secondary | ICD-10-CM

## 2011-10-26 DIAGNOSIS — E785 Hyperlipidemia, unspecified: Secondary | ICD-10-CM

## 2011-10-26 DIAGNOSIS — I10 Essential (primary) hypertension: Secondary | ICD-10-CM

## 2011-10-26 DIAGNOSIS — M549 Dorsalgia, unspecified: Secondary | ICD-10-CM

## 2011-10-26 DIAGNOSIS — G8929 Other chronic pain: Secondary | ICD-10-CM

## 2011-10-26 NOTE — Assessment & Plan Note (Signed)
I have addressed  BMI and recommended a low glycemic index diet utilitzing smaller more frequent meals to aid his metabolism.  I have alse recommended that he start exercising with a goal of 30 minutes of aerobic exercise a minimum of 5 days per week.

## 2011-10-26 NOTE — Assessment & Plan Note (Signed)
Intermittent, with nonradiating symptoms.  Given his history of myeloma we will need to investigate his last imaging of back to rule out lytic lesion.

## 2011-10-26 NOTE — Assessment & Plan Note (Signed)
Well controlled on current medications.  No changes today. 

## 2011-10-26 NOTE — Patient Instructions (Addendum)
Read about  the low glycemic index diet to help lose weight  Consider 6 smaller meals daily instead of 3 large meals.   (avoid white bread, white potatoes, corn/grits/oatmeal,  bananas, pineapple , watermelon )   Sandwich thins,  Pita bread and flat bread by a company called [employer]  (low carb, low cal )  [employer] carries Toufayah  Low carb wrap    Cherries,  Berries,  Apples, pears,  Kiwi are ok    Choose steamed veggies instead of a potato.  Choose a green salad  Or cole slaw instead of potato or pasta salad as a side   Egg, tuna or chicken salads are all fine

## 2011-10-26 NOTE — Assessment & Plan Note (Signed)
Managed with atorvastatin despite age due to CAD and history of TIA.  He is due for fasting lipids

## 2011-10-26 NOTE — Progress Notes (Signed)
Subjective:    Patient ID: Billy Kane, male    DOB: [birth year], 78 y.o.   MRN: [id number]  HPI  78 yr old white male with history of multiple myeloma, CAD, atrial fibrillation presents for regular follow up on chronic conditions.  He has gained additional weight since his last visit due to overeating and lack of exercise.  Had a recent fall backwards which resulted in a skin tear on his right elbow,  He has recurrent mild low back pain which is nonradiating.  Past Medical History  Diagnosis Date  . Coronary artery disease   . Depression   . TIA (transient ischemic attack)   . CAD (coronary artery disease)     s/p AMI 1998  . Lymphocytic leukemia 12/10    small, diagnosed 12/10  . Multiple myeloma     diagnosed with monclonal spike fo 5 gm  . S/P coronary artery bypass graft x 4 1998    [facility], SVG to distal RCA, OM1 D1; LIMA to distal LAD  . Hypertension   . Hyperlipidemia   . Chronic kidney disease     Stage III, secondary to myeloma  . PUD (peptic ulcer disease)    Current Outpatient Prescriptions on File Prior to Visit  Medication Sig Dispense Refill  . acetaminophen (TYLENOL) 500 MG tablet Take 500 mg by mouth 2 (two) times daily.        Marland Kitchen amLODipine (NORVASC) 10 MG tablet Take 10 mg by mouth daily.        Marland Kitchen aspirin 81 MG EC tablet Take 81 mg by mouth daily.        Marland Kitchen atorvastatin (LIPITOR) 20 MG tablet Take 1 tablet (20 mg total) by mouth at bedtime.  30 tablet  3  . budesonide (PULMICORT FLEXHALER) 180 MCG/ACT inhaler Inhale 2 puffs into the lungs 2 (two) times daily.  1 Inhaler  3  . budesonide-formoterol (SYMBICORT) 160-4.5 MCG/ACT inhaler Inhale 1 puff into the lungs 2 (two) times daily as needed.        [name] Sodium (STOOL SOFTENER) 100 MG capsule Take 100 mg by mouth 2 (two) times daily as needed.        . donepezil (ARICEPT) 5 MG tablet Take 1 tablet (5 mg total) by mouth daily.  30 tablet  3  . escitalopram (LEXAPRO) 20 MG tablet TAKE 1 TABLET EVERY DAY  30 tablet   5  . finasteride (PROSCAR) 5 MG tablet Take 1 tablet (5 mg total) by mouth daily.  30 tablet  6  . HYDROcodone-acetaminophen (NORCO) 5-325 MG per tablet Take 1 tablet by mouth every 6 (six) hours as needed for pain (or cough).  40 tablet  0  . Lenalidomide (REVLIMID) 5 MG CAPS Take 1 capsule by mouth as directed. 1 capsule for 21 days then 7 days off then repeat       . levalbuterol (XOPENEX HFA) 45 MCG/ACT inhaler Inhale 1-2 puffs into the lungs every 4 (four) hours as needed for wheezing.  1 Inhaler  12  . methocarbamol (ROBAXIN) 500 MG tablet Take 1/2 to 1 tablet by mouth every 12 hours, as needed       . metoprolol tartrate (LOPRESSOR) 25 MG tablet Take 12.5 mg by mouth 2 (two) times daily.        . Tamsulosin HCl (FLOMAX) 0.4 MG CAPS Take by mouth daily.        Marland Kitchen warfarin (COUMADIN) 5 MG tablet Take as directed by Anticoagulation clinic  50 tablet  3  . warfarin (COUMADIN) 5 MG tablet TAKE 1 TABLET AS DIRECTED  45 tablet  3     Review of Systems  Constitutional: Positive for unexpected weight change. Negative for fever, chills, diaphoresis, activity change, appetite change and fatigue.  HENT: Negative for hearing loss, ear pain, nosebleeds, congestion, sore throat, facial swelling, rhinorrhea, sneezing, drooling, mouth sores, trouble swallowing, neck pain, neck stiffness, dental problem, voice change, postnasal drip, sinus pressure, tinnitus and ear discharge.   Eyes: Negative for photophobia, pain, discharge, redness, itching and visual disturbance.  Respiratory: Negative for apnea, cough, choking, chest tightness, shortness of breath, wheezing and stridor.   Cardiovascular: Negative for chest pain, palpitations and leg swelling.  Gastrointestinal: Negative for nausea, vomiting, abdominal pain, diarrhea, constipation, blood in stool, abdominal distention, anal bleeding and rectal pain.  Genitourinary: Negative for dysuria, urgency, frequency, hematuria, flank pain, decreased urine volume,  scrotal swelling, difficulty urinating and testicular pain.  Musculoskeletal: Positive for back pain. Negative for myalgias, joint swelling, arthralgias and gait problem.  Skin: Negative for color change, rash and wound.  Neurological: Positive for light-headedness. Negative for dizziness, tremors, seizures, syncope, speech difficulty, weakness, numbness and headaches.  Psychiatric/Behavioral: Negative for suicidal ideas, hallucinations, behavioral problems, confusion, sleep disturbance, dysphoric mood, decreased concentration and agitation. The patient is not nervous/anxious.        Objective:   Physical Exam  Constitutional: He is oriented to person, place, and time.  HENT:  Head: Normocephalic and atraumatic.  Mouth/Throat: Oropharynx is clear and moist.  Eyes: Conjunctivae and EOM are normal.  Neck: Normal range of motion. Neck supple. No JVD present. No thyromegaly present.  Cardiovascular: Normal rate, regular rhythm and normal heart sounds.   Pulmonary/Chest: Effort normal and breath sounds normal. He has no wheezes. He has no rales.  Abdominal: Soft. Bowel sounds are normal. He exhibits no mass. There is no tenderness. There is no rebound.  Musculoskeletal: Normal range of motion. He exhibits no edema.  Neurological: He is alert and oriented to person, place, and time.  Skin: Skin is warm and dry.  Psychiatric: He has a normal mood and affect.      Assessment & Plan:   Chronic back pain Intermittent, with nonradiating symptoms.  Given his history of myeloma we will need to investigate his last imaging of back to rule out lytic lesion.   Obesity (BMI 30-39.9) I have addressed  BMI and recommended a low glycemic index diet utilitzing smaller more frequent meals to aid his metabolism.  I have alse recommended that he start exercising with a goal of 30 minutes of aerobic exercise a minimum of 5 days per week.    Hypertension Well controlled on current medications.  No changes  today.  HYPERLIPIDEMIA-MIXED Managed with atorvastatin despite age due to CAD and history of TIA.  He is due for fasting lipids    Updated Medication List Outpatient Encounter Prescriptions as of 10/26/2011  Medication Sig Dispense Refill  . acetaminophen (TYLENOL) 500 MG tablet Take 500 mg by mouth 2 (two) times daily.        Marland Kitchen amLODipine (NORVASC) 10 MG tablet Take 10 mg by mouth daily.        Marland Kitchen aspirin 81 MG EC tablet Take 81 mg by mouth daily.        Marland Kitchen atorvastatin (LIPITOR) 20 MG tablet Take 1 tablet (20 mg total) by mouth at bedtime.  30 tablet  3  . budesonide (PULMICORT FLEXHALER) 180 MCG/ACT inhaler Inhale  2 puffs into the lungs 2 (two) times daily.  1 Inhaler  3  . budesonide-formoterol (SYMBICORT) 160-4.5 MCG/ACT inhaler Inhale 1 puff into the lungs 2 (two) times daily as needed.        [name] Sodium (STOOL SOFTENER) 100 MG capsule Take 100 mg by mouth 2 (two) times daily as needed.        . donepezil (ARICEPT) 5 MG tablet Take 1 tablet (5 mg total) by mouth daily.  30 tablet  3  . escitalopram (LEXAPRO) 20 MG tablet TAKE 1 TABLET EVERY DAY  30 tablet  5  . finasteride (PROSCAR) 5 MG tablet Take 1 tablet (5 mg total) by mouth daily.  30 tablet  6  . HYDROcodone-acetaminophen (NORCO) 5-325 MG per tablet Take 1 tablet by mouth every 6 (six) hours as needed for pain (or cough).  40 tablet  0  . Lenalidomide (REVLIMID) 5 MG CAPS Take 1 capsule by mouth as directed. 1 capsule for 21 days then 7 days off then repeat       . levalbuterol (XOPENEX HFA) 45 MCG/ACT inhaler Inhale 1-2 puffs into the lungs every 4 (four) hours as needed for wheezing.  1 Inhaler  12  . methocarbamol (ROBAXIN) 500 MG tablet Take 1/2 to 1 tablet by mouth every 12 hours, as needed       . metoprolol tartrate (LOPRESSOR) 25 MG tablet Take 12.5 mg by mouth 2 (two) times daily.        . Tamsulosin HCl (FLOMAX) 0.4 MG CAPS Take by mouth daily.        Marland Kitchen warfarin (COUMADIN) 5 MG tablet Take as directed by  Anticoagulation clinic  50 tablet  3  . warfarin (COUMADIN) 5 MG tablet TAKE 1 TABLET AS DIRECTED  45 tablet  3

## 2011-10-27 ENCOUNTER — Telehealth: Payer: Self-pay | Admitting: Internal Medicine

## 2011-10-27 DIAGNOSIS — M549 Dorsalgia, unspecified: Secondary | ICD-10-CM

## 2011-10-27 MED ORDER — TRAMADOL HCL 50 MG PO TABS: 50.0000 mg | ORAL_TABLET | Freq: Four times a day (QID) | ORAL | Status: AC | PRN

## 2011-10-27 NOTE — Telephone Encounter (Signed)
Rx has been called in, they will pick up x ray order and have that done tomorrow.

## 2011-10-27 NOTE — Telephone Encounter (Signed)
[phone] Pt daughter called wanting to know  If there was something they could give Billy Kane for the back pain. Some type of patch or cream.  bengay is not working  She almost Couldn't get him  Up this morning because of the pain. They have started him on his diet today to lose weight

## 2011-10-27 NOTE — Telephone Encounter (Signed)
We can try tramadol 50 mg every 6 hours prn   #60 but I want to x ray his back.  Will send order through [facility]

## 2011-10-28 ENCOUNTER — Ambulatory Visit: Payer: Self-pay | Admitting: Internal Medicine

## 2011-10-30 ENCOUNTER — Telehealth: Payer: Self-pay | Admitting: Internal Medicine

## 2011-10-30 NOTE — Telephone Encounter (Signed)
His back is coming from a mild vertebral compression fracture,  L1,  And from mild new degenerative disk disease at L4-L5.  We can discuss treatment for osteoporosis at next visit.  Is he taking the vicodin I prescribed for pain?

## 2011-10-30 NOTE — Telephone Encounter (Signed)
Notified patients son of results.  He was not sure if patient is taking the vicodin, but he is going to make a sooner appt than September to talk about treatment.

## 2011-11-02 LAB — COMPREHENSIVE METABOLIC PANEL
Alkaline Phosphatase: 67 U/L (ref 50–136)
Anion Gap: 7 (ref 7–16)
Bilirubin,Total: 0.5 mg/dL (ref 0.2–1.0)
Co2: 30 mmol/L (ref 21–32)
EGFR (Non-African Amer.): 47 — ABNORMAL LOW
Glucose: 120 mg/dL — ABNORMAL HIGH (ref 65–99)
Osmolality: 285 (ref 275–301)
SGOT(AST): 21 U/L (ref 15–37)

## 2011-11-02 LAB — CBC CANCER CENTER
Basophil %: 0.3 %
Eosinophil #: 0.3 x10 3/mm (ref 0.0–0.7)
Eosinophil %: 2.7 %
HCT: 38.7 % — ABNORMAL LOW (ref 40.0–52.0)
HGB: 12.9 g/dL — ABNORMAL LOW (ref 13.0–18.0)
Lymphocyte %: 45.1 %
MCH: 29 pg (ref 26.0–34.0)
MCV: 87 fL (ref 80–100)
Monocyte #: 0.9 x10 3/mm — ABNORMAL HIGH (ref 0.0–0.7)
Neutrophil #: 4.6 x10 3/mm (ref 1.4–6.5)
RDW: 15.5 % — ABNORMAL HIGH (ref 11.5–14.5)
WBC: 10.5 x10 3/mm (ref 3.8–10.6)

## 2011-11-04 ENCOUNTER — Telehealth: Payer: Self-pay | Admitting: Internal Medicine

## 2011-11-04 ENCOUNTER — Ambulatory Visit (INDEPENDENT_AMBULATORY_CARE_PROVIDER_SITE_OTHER): Payer: Medicare Other | Admitting: *Deleted

## 2011-11-04 ENCOUNTER — Encounter: Payer: Self-pay | Admitting: Cardiology

## 2011-11-04 ENCOUNTER — Ambulatory Visit (INDEPENDENT_AMBULATORY_CARE_PROVIDER_SITE_OTHER): Payer: Medicare Other | Admitting: Cardiology

## 2011-11-04 VITALS — BP 92/60 | HR 60 | Ht 66.0 in | Wt 188.0 lb

## 2011-11-04 DIAGNOSIS — N189 Chronic kidney disease, unspecified: Secondary | ICD-10-CM

## 2011-11-04 DIAGNOSIS — I4891 Unspecified atrial fibrillation: Secondary | ICD-10-CM

## 2011-11-04 DIAGNOSIS — R0602 Shortness of breath: Secondary | ICD-10-CM

## 2011-11-04 DIAGNOSIS — E785 Hyperlipidemia, unspecified: Secondary | ICD-10-CM

## 2011-11-04 DIAGNOSIS — Z7901 Long term (current) use of anticoagulants: Secondary | ICD-10-CM

## 2011-11-04 DIAGNOSIS — E669 Obesity, unspecified: Secondary | ICD-10-CM

## 2011-11-04 DIAGNOSIS — I6529 Occlusion and stenosis of unspecified carotid artery: Secondary | ICD-10-CM

## 2011-11-04 DIAGNOSIS — I10 Essential (primary) hypertension: Secondary | ICD-10-CM

## 2011-11-04 DIAGNOSIS — I25810 Atherosclerosis of coronary artery bypass graft(s) without angina pectoris: Secondary | ICD-10-CM

## 2011-11-04 NOTE — Assessment & Plan Note (Signed)
Asymptomatic. Continue secondary preventive care.

## 2011-11-04 NOTE — Assessment & Plan Note (Signed)
He is in sinus rhythm today. He is asymptomatic if he is having atrial fib. His heart rate is no longer slow. No changes made in therapy. Fall risk reviewed with his son and caretaker. If he falls and hits his head, he must be assessed medically. I will see him back in the office in one year.

## 2011-11-04 NOTE — Patient Instructions (Signed)
Your physician wants you to follow-up in: 1 year. You will receive a reminder letter in the mail two months in advance. If you don't receive a letter, please call our office to schedule the follow-up appointment.  

## 2011-11-04 NOTE — Telephone Encounter (Signed)
i will need to have a discussion with Dr. Doylene Canning about whether there are any treatments that are safe to use in patients his age with multiple myeloma.  There may not be.  Please find out fro mhis nurse  if he has a time I can talk to him briefly about it

## 2011-11-04 NOTE — Progress Notes (Signed)
HPI Billy Kane Returns today for evaluation and management his history of paroxysmal  Atrial fibrillation, history of bradycardia, history of coronary disease with a history of bypass surgery 14 years ago.  I saw him last year for new onset A. Fib. His rates were slow at times. We decreased his metoprolol to 12 1/2 mg twice a day. Continues on this dose. He remains on anticoagulation. Reports no melena or hematochezia.  He is compliant with  his medications. He has 24/7 sitters, one of which is here today.  He denies any symptoms of a fib including palpitations, presyncope, chest discomfort. He is fallen once when he tripped putting on his pajamas. He has a compression fracture in his low back. He has not hit his head.  Past Medical History  Diagnosis Date  . Coronary artery disease   . Depression   . TIA (transient ischemic attack)   . CAD (coronary artery disease)     s/p AMI 1998  . Lymphocytic leukemia 12/10    small, diagnosed 12/10  . Multiple myeloma     diagnosed with monclonal spike fo 5 gm  . S/P coronary artery bypass graft x 4 1998    [facility], SVG to distal RCA, OM1 D1; LIMA to distal LAD  . Hypertension   . Hyperlipidemia   . Chronic kidney disease     Stage III, secondary to myeloma  . PUD (peptic ulcer disease)     Current Outpatient Prescriptions  Medication Sig Dispense Refill  . acetaminophen (TYLENOL) 500 MG tablet Take 500 mg by mouth 2 (two) times daily.        Marland Kitchen amLODipine (NORVASC) 10 MG tablet Take 10 mg by mouth daily.        Marland Kitchen aspirin 81 MG EC tablet Take 81 mg by mouth daily.        Marland Kitchen atorvastatin (LIPITOR) 20 MG tablet Take 1 tablet (20 mg total) by mouth at bedtime.  30 tablet  3  . budesonide (PULMICORT FLEXHALER) 180 MCG/ACT inhaler Inhale 2 puffs into the lungs 2 (two) times daily.  1 Inhaler  3  . budesonide-formoterol (SYMBICORT) 160-4.5 MCG/ACT inhaler Inhale 1 puff into the lungs 2 (two) times daily as needed.        [name] Sodium (STOOL  SOFTENER) 100 MG capsule Take 100 mg by mouth 2 (two) times daily as needed.        . donepezil (ARICEPT) 5 MG tablet Take 1 tablet (5 mg total) by mouth daily.  30 tablet  3  . escitalopram (LEXAPRO) 20 MG tablet TAKE 1 TABLET EVERY DAY  30 tablet  5  . fentaNYL (DURAGESIC - DOSED MCG/HR) 12 MCG/HR Place 1 patch onto the skin every 3 (three) days.      . finasteride (PROSCAR) 5 MG tablet Take 1 tablet (5 mg total) by mouth daily.  30 tablet  6  . HYDROcodone-acetaminophen (NORCO) 5-325 MG per tablet Take 1 tablet by mouth every 6 (six) hours as needed for pain (or cough).  40 tablet  0  . Lenalidomide (REVLIMID) 5 MG CAPS Take 1 capsule by mouth as directed. 1 capsule for 21 days then 7 days off then repeat       . levalbuterol (XOPENEX HFA) 45 MCG/ACT inhaler Inhale 1-2 puffs into the lungs every 4 (four) hours as needed for wheezing.  1 Inhaler  12  . methocarbamol (ROBAXIN) 500 MG tablet Take 1/2 to 1 tablet by mouth every 12 hours, as needed       .  metoprolol tartrate (LOPRESSOR) 25 MG tablet Take 12.5 mg by mouth 2 (two) times daily.        . Tamsulosin HCl (FLOMAX) 0.4 MG CAPS Take by mouth daily.        . traMADol (ULTRAM) 50 MG tablet Take 1 tablet (50 mg total) by mouth every 6 (six) hours as needed for pain.  60 tablet  0  . warfarin (COUMADIN) 5 MG tablet TAKE 1 TABLET AS DIRECTED  45 tablet  3    No Known Allergies  Family History  Problem Relation Age of Onset  . Coronary artery disease Father   . Heart attack Father 33  . Coronary artery disease Brother   . Heart disease Mother     CAD   . Cancer Brother     History   Social History  . Marital Status: Widowed    Spouse Name: N/A    Number of Children: N/A  . Years of Education: N/A   Occupational History  . Not on file.   Social History Main Topics  . Smoking status: Never Smoker   . Smokeless tobacco: Never Used  . Alcohol Use: No  . Drug Use: No  . Sexually Active:    Other Topics Concern  . Not on file    Social History Narrative   ** Merged History Encounter **     ROS ALL NEGATIVE EXCEPT THOSE NOTED IN HPI  PE  General Appearance: well developed, well nourished in no acute distress, elderly, chronically ill but looks younger than stated age. HEENT: symmetrical face, PERRLA, good dentition  Neck: no JVD, thyromegaly, or adenopathy, trachea midline Chest: symmetric without deformity Cardiac: PMI non-displaced, RRR, normal S1, S2, no gallop or murmur Lung: clear to ausculation and percussion Vascular: all pulses full without bruits  Abdominal: nondistended, nontender, good bowel sounds, no HSM, no bruits Extremities: no cyanosis, clubbing or edema, no sign of DVT, no varicosities  Skin: normal color, no rashes Neuro: alert and oriented x 3, non-focal Pysch: normal affect  EKG Normal sinus rhythm with first-degree AV block otherwise normal. BMET    Component Value Date/Time   NA 140 04/24/2011 1148   K 4.5 04/24/2011 1148   CL 104 04/24/2011 1148   CO2 31 04/24/2011 1148   GLUCOSE 102* 04/24/2011 1148   BUN 21 04/24/2011 1148   CREATININE 1.5 04/24/2011 1148   CALCIUM 9.1 04/24/2011 1148   GFRNONAA 50 04/17/2009 0954    Lipid Panel     Component Value Date/Time   CHOL 179 10/25/2008   TRIG 195 10/25/2008   HDL 42 10/25/2008   VLDL 39 10/25/2008   LDLCALC 98 10/25/2008    CBC    Component Value Date/Time   WBC 10.3 04/24/2011 1148   RBC 4.06* 04/24/2011 1148   HGB 11.9* 04/24/2011 1148   HCT 36.0* 04/24/2011 1148   PLT 181.0 04/24/2011 1148   MCV 88.7 04/24/2011 1148   MCHC 33.1 04/24/2011 1148   RDW 14.1 04/24/2011 1148   LYMPHSABS 3.1 04/24/2011 1148   MONOABS 1.2* 04/24/2011 1148   EOSABS 0.3 04/24/2011 1148   BASOSABS 0.0 04/24/2011 1148

## 2011-11-04 NOTE — Assessment & Plan Note (Signed)
Stable. No symptoms of angina or ischemia. Continue secondary preventive therapy.

## 2011-11-05 ENCOUNTER — Telehealth: Payer: Self-pay | Admitting: Internal Medicine

## 2011-11-05 DIAGNOSIS — S32020A Wedge compression fracture of second lumbar vertebra, initial encounter for closed fracture: Secondary | ICD-10-CM

## 2011-11-05 NOTE — Telephone Encounter (Signed)
Left message for Dr. Aleda Grana nurse to let me know when is a good time for you to speak to him.

## 2011-11-05 NOTE — Telephone Encounter (Signed)
Dr. Darrick Huntsman spoke to Dr. Koleen Nimrod about patient.

## 2011-11-05 NOTE — Telephone Encounter (Signed)
Osteoporosis.  Thanks

## 2011-11-05 NOTE — Telephone Encounter (Signed)
Billy Kane  Or [name] see available dates below to schedule DEXA

## 2011-11-05 NOTE — Telephone Encounter (Signed)
Patients son stated that was fine to go forth with the DEXA scan.  He stated he can't go Monday, Wednesday, or Thursday of next week.  Please advise.

## 2011-11-05 NOTE — Telephone Encounter (Signed)
Left message asking patients daughter to return my call.

## 2011-11-05 NOTE — Telephone Encounter (Signed)
I called and spoke with Billy Kane and she states this won't be covered under insurance it needs to be either osteoporosis Vit. D deficiency or high risk medication.  I didn't schedule until I hear back from Dr. Darrick Huntsman.

## 2011-11-05 NOTE — Telephone Encounter (Signed)
Please tell Billy Kane that Dr. Doylene Canning and I have discussed his compression fracture,.  We are ordering a DEXA scan to look at the bone density in his spine and hips.  We should have this information before we schedule a followup for discussion.

## 2011-11-06 ENCOUNTER — Encounter: Payer: Self-pay | Admitting: Internal Medicine

## 2011-11-12 ENCOUNTER — Ambulatory Visit: Payer: Self-pay | Admitting: Internal Medicine

## 2011-11-16 ENCOUNTER — Ambulatory Visit: Payer: Self-pay | Admitting: Oncology

## 2011-11-17 ENCOUNTER — Telehealth: Payer: Self-pay | Admitting: Internal Medicine

## 2011-11-17 ENCOUNTER — Other Ambulatory Visit: Payer: Self-pay | Admitting: Internal Medicine

## 2011-11-17 ENCOUNTER — Encounter: Payer: Self-pay | Admitting: Internal Medicine

## 2011-11-17 DIAGNOSIS — N401 Enlarged prostate with lower urinary tract symptoms: Secondary | ICD-10-CM

## 2011-11-17 MED ORDER — FINASTERIDE 5 MG PO TABS: 5.0000 mg | ORAL_TABLET | Freq: Every day | ORAL | Status: DC

## 2011-11-17 NOTE — Telephone Encounter (Signed)
His bone density test was positive for osteoporosis.  We are sending a copy to Dr. Doylene Canning so he can decide whether Mr. Mccravy should get IV pamidronate or zolendronate as treatment.  We will also send a copy to his kidney doctor, because he may not think it is safe given his kidney function.

## 2011-11-18 NOTE — Telephone Encounter (Signed)
MyChart message responded to regarding his req for these results.

## 2011-11-19 NOTE — Telephone Encounter (Signed)
Results sent to Dr. Doylene Canning and Dr. Cherylann Ratel.

## 2011-11-25 ENCOUNTER — Encounter: Payer: Self-pay | Admitting: Internal Medicine

## 2011-11-25 ENCOUNTER — Ambulatory Visit (INDEPENDENT_AMBULATORY_CARE_PROVIDER_SITE_OTHER): Payer: Medicare Other

## 2011-11-25 DIAGNOSIS — I4891 Unspecified atrial fibrillation: Secondary | ICD-10-CM

## 2011-11-25 DIAGNOSIS — Z7901 Long term (current) use of anticoagulants: Secondary | ICD-10-CM

## 2011-11-25 LAB — POCT INR: INR: 1.9

## 2011-11-30 ENCOUNTER — Encounter: Payer: Self-pay | Admitting: Internal Medicine

## 2011-12-02 LAB — CBC CANCER CENTER
Basophil #: 0 x10 3/mm (ref 0.0–0.1)
Basophil %: 0.4 %
Eosinophil #: 0.2 x10 3/mm (ref 0.0–0.7)
Eosinophil %: 2.6 %
HGB: 12.9 g/dL — ABNORMAL LOW (ref 13.0–18.0)
Lymphocyte #: 2.7 x10 3/mm (ref 1.0–3.6)
MCH: 28.1 pg (ref 26.0–34.0)
MCHC: 32.1 g/dL (ref 32.0–36.0)
MCV: 88 fL (ref 80–100)
Monocyte %: 8.2 %
Neutrophil #: 4.1 x10 3/mm (ref 1.4–6.5)
Platelet: 151 x10 3/mm (ref 150–440)
RBC: 4.59 10*6/uL (ref 4.40–5.90)
RDW: 15.5 % — ABNORMAL HIGH (ref 11.5–14.5)

## 2011-12-02 LAB — COMPREHENSIVE METABOLIC PANEL
Albumin: 3.7 g/dL (ref 3.4–5.0)
Anion Gap: 7 (ref 7–16)
BUN: 18 mg/dL (ref 7–18)
Calcium, Total: 8.9 mg/dL (ref 8.5–10.1)
Chloride: 105 mmol/L (ref 98–107)
Co2: 30 mmol/L (ref 21–32)
EGFR (African American): 51 — ABNORMAL LOW
EGFR (Non-African Amer.): 44 — ABNORMAL LOW
Potassium: 3.7 mmol/L (ref 3.5–5.1)
SGOT(AST): 24 U/L (ref 15–37)
SGPT (ALT): 26 U/L
Sodium: 142 mmol/L (ref 136–145)
Total Protein: 6.5 g/dL (ref 6.4–8.2)

## 2011-12-03 LAB — PROT IMMUNOELECTROPHORES(ARMC)

## 2011-12-08 ENCOUNTER — Other Ambulatory Visit: Payer: Self-pay | Admitting: Cardiology

## 2011-12-16 ENCOUNTER — Other Ambulatory Visit: Payer: Self-pay | Admitting: Internal Medicine

## 2011-12-16 ENCOUNTER — Ambulatory Visit: Payer: Self-pay | Admitting: Oncology

## 2011-12-16 ENCOUNTER — Ambulatory Visit (INDEPENDENT_AMBULATORY_CARE_PROVIDER_SITE_OTHER): Payer: Medicare Other

## 2011-12-16 DIAGNOSIS — I4891 Unspecified atrial fibrillation: Secondary | ICD-10-CM

## 2011-12-16 DIAGNOSIS — Z7901 Long term (current) use of anticoagulants: Secondary | ICD-10-CM

## 2011-12-16 MED ORDER — ESCITALOPRAM OXALATE 20 MG PO TABS: 20.0000 mg | ORAL_TABLET | Freq: Every day | ORAL | Status: DC

## 2011-12-18 ENCOUNTER — Other Ambulatory Visit: Payer: Self-pay | Admitting: Cardiology

## 2011-12-30 LAB — COMPREHENSIVE METABOLIC PANEL
Albumin: 3.8 g/dL (ref 3.4–5.0)
Alkaline Phosphatase: 57 U/L (ref 50–136)
BUN: 17 mg/dL (ref 7–18)
Bilirubin,Total: 0.5 mg/dL (ref 0.2–1.0)
Calcium, Total: 8.9 mg/dL (ref 8.5–10.1)
Chloride: 103 mmol/L (ref 98–107)
Creatinine: 1.43 mg/dL — ABNORMAL HIGH (ref 0.60–1.30)
EGFR (African American): 51 — ABNORMAL LOW
Glucose: 98 mg/dL (ref 65–99)
SGOT(AST): 22 U/L (ref 15–37)
SGPT (ALT): 25 U/L
Sodium: 140 mmol/L (ref 136–145)

## 2011-12-30 LAB — CBC CANCER CENTER
Basophil #: 0 x10 3/mm (ref 0.0–0.1)
HCT: 41 % (ref 40.0–52.0)
MCH: 28.4 pg (ref 26.0–34.0)
MCV: 88 fL (ref 80–100)
Neutrophil #: 4.4 x10 3/mm (ref 1.4–6.5)
Neutrophil %: 46.5 %
Platelet: 166 x10 3/mm (ref 150–440)
WBC: 9.3 x10 3/mm (ref 3.8–10.6)

## 2011-12-31 LAB — URINE IEP, RANDOM

## 2012-01-05 ENCOUNTER — Other Ambulatory Visit: Payer: Self-pay | Admitting: Pharmacist

## 2012-01-05 ENCOUNTER — Other Ambulatory Visit: Payer: Self-pay | Admitting: Internal Medicine

## 2012-01-05 ENCOUNTER — Other Ambulatory Visit: Payer: Self-pay | Admitting: Cardiology

## 2012-01-05 DIAGNOSIS — N401 Enlarged prostate with lower urinary tract symptoms: Secondary | ICD-10-CM

## 2012-01-05 MED ORDER — ATORVASTATIN CALCIUM 20 MG PO TABS: 20.0000 mg | ORAL_TABLET | Freq: Every day | ORAL | Status: DC

## 2012-01-05 MED ORDER — WARFARIN SODIUM 5 MG PO TABS: ORAL_TABLET | ORAL | Status: DC

## 2012-01-06 MED ORDER — FINASTERIDE 5 MG PO TABS: 5.0000 mg | ORAL_TABLET | Freq: Every day | ORAL | Status: DC

## 2012-01-06 MED ORDER — ESCITALOPRAM OXALATE 20 MG PO TABS: 20.0000 mg | ORAL_TABLET | Freq: Every day | ORAL | Status: DC

## 2012-01-06 MED ORDER — METOPROLOL TARTRATE 25 MG PO TABS: 12.5000 mg | ORAL_TABLET | Freq: Two times a day (BID) | ORAL | Status: DC

## 2012-01-06 MED ORDER — DONEPEZIL HCL 5 MG PO TABS: 5.0000 mg | ORAL_TABLET | Freq: Every day | ORAL | Status: DC

## 2012-01-07 ENCOUNTER — Other Ambulatory Visit: Payer: Self-pay | Admitting: Internal Medicine

## 2012-01-13 ENCOUNTER — Ambulatory Visit (INDEPENDENT_AMBULATORY_CARE_PROVIDER_SITE_OTHER): Payer: Medicare Other

## 2012-01-13 DIAGNOSIS — Z7901 Long term (current) use of anticoagulants: Secondary | ICD-10-CM

## 2012-01-13 DIAGNOSIS — I4891 Unspecified atrial fibrillation: Secondary | ICD-10-CM

## 2012-01-16 ENCOUNTER — Ambulatory Visit: Payer: Self-pay | Admitting: Oncology

## 2012-02-03 LAB — BASIC METABOLIC PANEL
Anion Gap: 4 — ABNORMAL LOW (ref 7–16)
Chloride: 105 mmol/L (ref 98–107)
Creatinine: 1.4 mg/dL — ABNORMAL HIGH (ref 0.60–1.30)

## 2012-02-03 LAB — CBC CANCER CENTER
Basophil #: 0 x10 3/mm (ref 0.0–0.1)
Eosinophil #: 0.2 x10 3/mm (ref 0.0–0.7)
HCT: 41 % (ref 40.0–52.0)
HGB: 13.2 g/dL (ref 13.0–18.0)
MCH: 28.3 pg (ref 26.0–34.0)
MCHC: 32.3 g/dL (ref 32.0–36.0)
Monocyte #: 1 x10 3/mm (ref 0.2–1.0)
Monocyte %: 9.4 %
Neutrophil #: 4.8 x10 3/mm (ref 1.4–6.5)
Neutrophil %: 46.6 %
Platelet: 152 x10 3/mm (ref 150–440)
RBC: 4.67 10*6/uL (ref 4.40–5.90)
RDW: 14.8 % — ABNORMAL HIGH (ref 11.5–14.5)
WBC: 10.4 x10 3/mm (ref 3.8–10.6)

## 2012-02-04 LAB — KAPPA/LAMBDA FREE LIGHT CHAINS (ARMC)

## 2012-02-04 LAB — PROT IMMUNOELECTROPHORES(ARMC)

## 2012-02-10 ENCOUNTER — Ambulatory Visit (INDEPENDENT_AMBULATORY_CARE_PROVIDER_SITE_OTHER): Payer: Medicare Other

## 2012-02-10 DIAGNOSIS — Z7901 Long term (current) use of anticoagulants: Secondary | ICD-10-CM

## 2012-02-10 DIAGNOSIS — I4891 Unspecified atrial fibrillation: Secondary | ICD-10-CM

## 2012-02-10 LAB — POCT INR: INR: 2

## 2012-02-12 ENCOUNTER — Other Ambulatory Visit: Payer: Self-pay | Admitting: Internal Medicine

## 2012-02-15 ENCOUNTER — Ambulatory Visit: Payer: Self-pay | Admitting: Oncology

## 2012-03-01 ENCOUNTER — Other Ambulatory Visit: Payer: Self-pay | Admitting: Internal Medicine

## 2012-03-09 ENCOUNTER — Ambulatory Visit (INDEPENDENT_AMBULATORY_CARE_PROVIDER_SITE_OTHER): Payer: Medicare Other

## 2012-03-09 DIAGNOSIS — I4891 Unspecified atrial fibrillation: Secondary | ICD-10-CM

## 2012-03-09 DIAGNOSIS — Z7901 Long term (current) use of anticoagulants: Secondary | ICD-10-CM

## 2012-03-30 ENCOUNTER — Ambulatory Visit: Payer: Self-pay | Admitting: Oncology

## 2012-03-30 LAB — COMPREHENSIVE METABOLIC PANEL
Albumin: 3.8 g/dL (ref 3.4–5.0)
Anion Gap: 8 (ref 7–16)
BUN: 20 mg/dL — ABNORMAL HIGH (ref 7–18)
Calcium, Total: 8.9 mg/dL (ref 8.5–10.1)
Chloride: 103 mmol/L (ref 98–107)
Co2: 30 mmol/L (ref 21–32)
EGFR (African American): 46 — ABNORMAL LOW
Glucose: 105 mg/dL — ABNORMAL HIGH (ref 65–99)
Osmolality: 284 (ref 275–301)
Potassium: 3.9 mmol/L (ref 3.5–5.1)
SGOT(AST): 20 U/L (ref 15–37)
Sodium: 141 mmol/L (ref 136–145)
Total Protein: 7.3 g/dL (ref 6.4–8.2)

## 2012-03-30 LAB — CBC CANCER CENTER
Basophil #: 0 x10 3/mm (ref 0.0–0.1)
Eosinophil #: 0.2 x10 3/mm (ref 0.0–0.7)
Lymphocyte #: 4.6 x10 3/mm — ABNORMAL HIGH (ref 1.0–3.6)
Lymphocyte %: 46.6 %
MCV: 89 fL (ref 80–100)
Monocyte %: 6.5 %
Neutrophil #: 4.4 x10 3/mm (ref 1.4–6.5)
Neutrophil %: 45 %
Platelet: 165 x10 3/mm (ref 150–440)
RBC: 4.72 10*6/uL (ref 4.40–5.90)
RDW: 14.2 % (ref 11.5–14.5)
WBC: 9.9 x10 3/mm (ref 3.8–10.6)

## 2012-03-31 LAB — PROT IMMUNOELECTROPHORES(ARMC)

## 2012-04-06 ENCOUNTER — Other Ambulatory Visit: Payer: Self-pay | Admitting: Cardiology

## 2012-04-06 ENCOUNTER — Ambulatory Visit (INDEPENDENT_AMBULATORY_CARE_PROVIDER_SITE_OTHER): Payer: Medicare Other

## 2012-04-06 DIAGNOSIS — Z7901 Long term (current) use of anticoagulants: Secondary | ICD-10-CM

## 2012-04-06 DIAGNOSIS — I4891 Unspecified atrial fibrillation: Secondary | ICD-10-CM

## 2012-04-17 ENCOUNTER — Ambulatory Visit: Payer: Self-pay | Admitting: Oncology

## 2012-04-27 ENCOUNTER — Encounter: Payer: Self-pay | Admitting: Internal Medicine

## 2012-04-27 ENCOUNTER — Ambulatory Visit (INDEPENDENT_AMBULATORY_CARE_PROVIDER_SITE_OTHER): Payer: Medicare Other | Admitting: Internal Medicine

## 2012-04-27 VITALS — BP 126/62 | HR 58 | Temp 97.8°F | Resp 14 | Wt 192.8 lb

## 2012-04-27 DIAGNOSIS — M25519 Pain in unspecified shoulder: Secondary | ICD-10-CM

## 2012-04-27 DIAGNOSIS — I10 Essential (primary) hypertension: Secondary | ICD-10-CM

## 2012-04-27 DIAGNOSIS — I4891 Unspecified atrial fibrillation: Secondary | ICD-10-CM

## 2012-04-27 DIAGNOSIS — I25810 Atherosclerosis of coronary artery bypass graft(s) without angina pectoris: Secondary | ICD-10-CM

## 2012-04-27 DIAGNOSIS — E785 Hyperlipidemia, unspecified: Secondary | ICD-10-CM

## 2012-04-27 DIAGNOSIS — M25512 Pain in left shoulder: Secondary | ICD-10-CM | POA: Insufficient documentation

## 2012-04-27 DIAGNOSIS — C9000 Multiple myeloma not having achieved remission: Secondary | ICD-10-CM

## 2012-04-27 NOTE — Assessment & Plan Note (Signed)
Managed with atorvastatin, goal LDL of 70 or less. He has not had a fasting lipid panel in some time due to his other more urgent medical issues. We'll request that this be done with his next blood draw either by nephrology or oncology.

## 2012-04-27 NOTE — Assessment & Plan Note (Signed)
He has a history of rotator cuff syndrome on that side and has deferred surgery in the past it was recommended by Dr. Erin Sons. Have recommended that he see him again for steroid injection. Referral made.  Recommended resuming tramadol as needed. He is currently taking 2000 mg of Tylenol daily. We are avoiding Vicodin because it makes him too sleepy. We are avoiding NSAIDs because of his kidney disease.

## 2012-04-27 NOTE — Assessment & Plan Note (Signed)
Review of measurements indicate some labile pressures related to pain. However most of his blood pressures are in within range and we discussed increasing his doses of medications would probably cause him to become orthostatic again.

## 2012-04-27 NOTE — Assessment & Plan Note (Addendum)
He has had a recent increase in his M spike and has been started on Revlimid by Dr. Doylene Canning.  Family is concerned that his increased shoulder pain is due to the medication or due to the myeloma. He sees Dr. Sedalia Muta next week at which time I recommended that they suggested he increase his fentanyl dose if the trial of tramadol this week does not alleviate his pain.

## 2012-04-27 NOTE — Assessment & Plan Note (Signed)
He has been asymptomatic recently.

## 2012-04-27 NOTE — Patient Instructions (Addendum)
Try resuming tramadol to help the tylenol work better.  If the tramadol is not effective in controlling pain,  Ask Dr. Doylene Canning to increase the dose or the frequency of the Fentanyl patch.   It would be a good idea to see Dr. Gavin Potters about a steroid shot.     This is  Dr. Norton Blizzard version of a  "Low GI"  Diet:  All of the foods can be found at grocery stores and in bulk at [employer].  The Atkins protein bars and shakes are available in more varieties at [employer], [employer] and [employer].     7 AM Breakfast:  Low carbohydrate Protein  Shakes (I recommend the EAS AdvantEdge "Carb Control" shakes  Or the low carb shakes by Atkins.   Both are available everywhere:  In  cases at [employer]Js  Or in 4 packs at grocery stores and pharmacies  2.5 carbs  (Alternative is  a toasted Arnold's Sandwhich Thin w/ peanut butter, a "Bagel Thin" with cream cheese and salmon) or  a scrambled egg burrito made with a low carb tortilla .  Avoid cereal and bananas, oatmeal too unless you are cooking the old fashioned kind that takes 30-40 minutes to prepare.  the rest is overly processed, has minimal fiber, and is loaded with carbohydrates!   10 AM: Protein bar by Atkins (the snack size, under 200 cal).  There are many varieties , available widely again or in bulk in limited varieties at [employer]Js)  Other so called "protein bars" tend to be loaded with carbohydrates.  Remember, in food advertising, the word "energy" is synonymous for " carbohydrate."  Lunch: sandwich of [place], (or any lunchmeat, grilled meat or canned tuna), fresh avocado, mayonnaise  and cheese on a lower carbohydrate pita bread, flatbread, or tortilla . Ok to use regular mayonnaise. The bread is the only source or carbohydrate that can be decreased (Joseph's makes a pita bread and a flat bread that are 50 cal and 4 net carbs ; Toufayan makes a low carb flatbread that's 100 cal and 9 net carbs  and  Mission makes a low carb whole wheat tortilla  That is 210 cal and 6 net  carbs)  3 PM:  Mid day :  Another protein bar,  Or a  cheese stick (100 cal, 0 carbs),  Or 1 ounce of  almonds, walnuts, pistachios, pecans, peanuts,  Macadamia nuts. Or a Dannon light n Fit greek yogurt, 80 cal 8 net carbs . Avoid "granola"; the dried cranberries and raisins are loaded with carbohydrates. Mixed nuts ok if no raisins or cranberries or dried fruit.      6 PM  Dinner:  "mean and green:"  Meat/chicken/fish or a high protein legume; , with a green salad, and a low GI  Veggie (broccoli, cauliflower, green beans, spinach, brussel sprouts. Lima beans) : Avoid "Low fat dressings, as well as Reyne Dumas and [address]! They are loaded with sugar! Instead use ranch, vinagrette,  Blue cheese, etc  9 PM snack : Breyer's "low carb" fudgsicle or  ice cream bar (Carb Smart line), or  Weight Watcher's ice cream bar , or another "no sugar added" ice cream;a serving of fresh berries/cherries with whipped cream (Avoid bananas, pineapple, grapes  and watermelon on a regular basis because they are high in sugar)   Remember that snack Substitutions should be less than 15 to 20 carbs  Per serving. Remember to subtract fiber grams and sugar alcohols to get the "net  carbs."

## 2012-04-27 NOTE — Progress Notes (Signed)
Patient ID: Billy Kane, male   DOB: [birth year], 78 y.o.   MRN: [id number] Patient Active Problem List  Diagnosis  . HYPERLIPIDEMIA-MIXED  . CORONARY ATHEROSCLEROSIS OF ARTERY BYPASS GRAFT  . SHORTNESS OF BREATH  . CHEST PAIN UNSPECIFIED  . CORONARY ARTERY BYPASS GRAFT, HX OF  . ATRIAL FIBRILLATION  . CAROTID ARTERY STENOSIS, WITHOUT INFARCTION  . Long term current use of anticoagulant  . Multiple myeloma  . Hypertension  . Hyperlipidemia  . Chronic kidney disease  . PUD (peptic ulcer disease)  . Bronchitis  . Chronic back pain  . Obesity (BMI 30-39.9)  . Shoulder pain, left    Subjective:  CC:   Chief Complaint  Patient presents with  . Follow-up    6 month    HPI:   Billy Kane a 78 y.o. male who presents Followup on acute and chronic medical issues including atrial fibrillation, hypertension, multiple myeloma, chronic kidney disease, and dementia.. In the interim since his last visit he has been experiencing shoulder pain on the left which has been worse for the last 3 weeks. His family wonders whether it is due to the treatment for myeloma that has been resumed due to increased protein and/M. spike. His kidney function has also been noted to decline slightly per evaluation by his or his nephrologist. Shoulder his caretaker has been using heating pad and Aspercreme. They have been avoiding oral NSAIDs. There is no history of fall. He does have a history of rotator cuff syndrome and was offered surgery by Jake Shark: The past which patient deferred. He has had steroid injections in the past which have helped the shoulder. His last steroid injection was over a year ago. He minimizes his pain but according to his family he is having trouble putting his shirt on without assistance.    Past Medical History  Diagnosis Date  . Coronary artery disease   . Depression   . TIA (transient ischemic attack)   . CAD (coronary artery disease)     s/p AMI 1998  . Lymphocytic leukemia 12/10      small, diagnosed 12/10  . Multiple myeloma     diagnosed with monclonal spike fo 5 gm  . S/P coronary artery bypass graft x 4 1998    [facility], SVG to distal RCA, OM1 D1; LIMA to distal LAD  . Hypertension   . Hyperlipidemia   . Chronic kidney disease     Stage III, secondary to myeloma  . PUD (peptic ulcer disease)     Past Surgical History  Procedure Date  . Cholecystectomy   . Cholecystectomy > 20 years ago  . Coronary artery bypass graft          The following portions of the patient's history were reviewed and updated as appropriate: Allergies, current medications, and problem list.    Review of Systems:   12 Pt  review of systems was negative except those addressed in the HPI,     History   Social History  . Marital Status: Widowed    Spouse Name: N/A    Number of Children: N/A  . Years of Education: N/A   Occupational History  . Not on file.   Social History Main Topics  . Smoking status: Never Smoker   . Smokeless tobacco: Never Used  . Alcohol Use: No  . Drug Use: No  . Sexually Active:    Other Topics Concern  . Not on file   Social History Narrative   **  Merged History Encounter **     Objective:  BP 126/62  Pulse 58  Temp 97.8 F (36.6 C) (Oral)  Resp 14  Wt 192 lb 12 oz (87.431 kg)  SpO2 97%  General appearance: alert, cooperative and appears stated age Ears: normal TM's and external ear canals both ears Throat: lips, mucosa, and tongue normal; teeth and gums normal Neck: no adenopathy, no carotid bruit, supple, symmetrical, trachea midline and thyroid not enlarged, symmetric, no tenderness/mass/nodules Back: symmetric, no curvature. ROM normal. No CVA tenderness. Lungs: clear to auscultation bilaterally Heart: irreg irreg, S1, S2 normal, no murmur, click, rub or gallop MSK decreased ROM L shoulder secondary to pain .  Distal strength 5/5 on left, 4+/5 on right with thenar muscle wasting noted.  Abdomen: soft, non-tender; bowel  sounds normal; no masses,  no organomegaly Pulses: 2+ and symmetric Skin: Skin color, texture, turgor normal. No rashes or lesions Lymph nodes: Cervical, supraclavicular, and axillary nodes normal.  Assessment and Plan:  CORONARY ATHEROSCLEROSIS OF ARTERY BYPASS GRAFT He has been asymptomatic recently.  HYPERLIPIDEMIA-MIXED Managed with atorvastatin, goal LDL of 70 or less. He has not had a fasting lipid panel in some time due to his other more urgent medical issues. We'll request that this be done with his next blood draw either by nephrology or oncology.  ATRIAL FIBRILLATION Review of her blood pressure and pulse checks show that he is under good control with current metoprolol dose. No changes today.  Multiple myeloma He has had a recent increase in his M spike and has been started on Revlimid by Dr. Doylene Canning.  Family is concerned that his increased shoulder pain is due to the medication or due to the myeloma. He sees Dr. Sedalia Muta next week at which time I recommended that they suggested he increase his fentanyl dose if the trial of tramadol this week does not alleviate his pain.  Shoulder pain, left He has a history of rotator cuff syndrome on that side and has deferred surgery in the past it was recommended by Dr. Erin Sons. Have recommended that he see him again for steroid injection. Referral made.  Recommended resuming tramadol as needed. He is currently taking 2000 mg of Tylenol daily. We are avoiding Vicodin because it makes him too sleepy. We are avoiding NSAIDs because of his kidney disease.  Hypertension Review of measurements indicate some labile pressures related to pain. However most of his blood pressures are in within range and we discussed increasing his doses of medications would probably cause him to become orthostatic again.   Updated Medication List Outpatient Encounter Prescriptions as of 04/27/2012  Medication Sig Dispense Refill  . acetaminophen (TYLENOL) 500 MG  tablet Take 500 mg by mouth 2 (two) times daily.        Marland Kitchen amLODipine (NORVASC) 10 MG tablet Take 10 mg by mouth daily.        Marland Kitchen aspirin 81 MG EC tablet Take 81 mg by mouth daily.        Marland Kitchen atorvastatin (LIPITOR) 20 MG tablet Take 1 tablet (20 mg total) by mouth daily.  90 tablet  2  . budesonide (PULMICORT FLEXHALER) 180 MCG/ACT inhaler Inhale 2 puffs into the lungs 2 (two) times daily.  1 Inhaler  3  . budesonide-formoterol (SYMBICORT) 160-4.5 MCG/ACT inhaler Inhale 1 puff into the lungs 2 (two) times daily as needed.        [name] Sodium (STOOL SOFTENER) 100 MG capsule Take 100 mg by mouth 2 (two) times  daily as needed.        . donepezil (ARICEPT) 5 MG tablet Take 1 tablet (5 mg total) by mouth daily.  90 tablet  5  . escitalopram (LEXAPRO) 20 MG tablet Take 1 tablet (20 mg total) by mouth daily.  90 tablet  5  . fentaNYL (DURAGESIC - DOSED MCG/HR) 12 MCG/HR Place 1 patch onto the skin every 3 (three) days.      . finasteride (PROSCAR) 5 MG tablet Take 1 tablet (5 mg total) by mouth daily.  90 tablet  5  . HYDROcodone-acetaminophen (NORCO) 5-325 MG per tablet Take 1 tablet by mouth every 6 (six) hours as needed for pain (or cough).  40 tablet  0  . Lenalidomide (REVLIMID) 5 MG CAPS Take 1 capsule by mouth as directed. 1 capsule for 21 days then 7 days off then repeat       . levalbuterol (XOPENEX HFA) 45 MCG/ACT inhaler Inhale 1-2 puffs into the lungs every 4 (four) hours as needed for wheezing.  1 Inhaler  12  . methocarbamol (ROBAXIN) 500 MG tablet Take 1/2 to 1 tablet by mouth every 12 hours, as needed       . metoprolol tartrate (LOPRESSOR) 25 MG tablet Take 0.5 tablets (12.5 mg total) by mouth 2 (two) times daily.  90 tablet  5  . Tamsulosin HCl (FLOMAX) 0.4 MG CAPS TAKE 1 CAPSULE EVERY DAY  30 capsule  12  . warfarin (COUMADIN) 5 MG tablet 7 mg Tues Fri, 5 mg all other days      . DISCONTD: warfarin (COUMADIN) 5 MG tablet Take as directed by Anticoagulation clinic  45 tablet  3  .  DISCONTD: donepezil (ARICEPT) 5 MG tablet TAKE 1 TABLET (5 MG TOTAL) BY MOUTH DAILY.  30 tablet  5  . DISCONTD: warfarin (COUMADIN) 5 MG tablet TAKE 1 TABLET AS DIRECTED  45 tablet  3     Orders Placed This Encounter  Procedures  . Ambulatory referral to Orthopedic Surgery    Return in about 6 months (around 10/25/2012).

## 2012-04-27 NOTE — Assessment & Plan Note (Signed)
Review of her blood pressure and pulse checks show that he is under good control with current metoprolol dose. No changes today.

## 2012-05-03 LAB — CBC CANCER CENTER
Basophil #: 0 x10 3/mm (ref 0.0–0.1)
Basophil %: 0.5 %
Eosinophil #: 0.3 x10 3/mm (ref 0.0–0.7)
Lymphocyte #: 3.5 x10 3/mm (ref 1.0–3.6)
Lymphocyte %: 37 %
MCH: 28.7 pg (ref 26.0–34.0)
MCHC: 32.6 g/dL (ref 32.0–36.0)
MCV: 88 fL (ref 80–100)
Monocyte #: 1.2 x10 3/mm — ABNORMAL HIGH (ref 0.2–1.0)
Neutrophil #: 4.4 x10 3/mm (ref 1.4–6.5)
Platelet: 153 x10 3/mm (ref 150–440)
RDW: 14.1 % (ref 11.5–14.5)
WBC: 9.4 x10 3/mm (ref 3.8–10.6)

## 2012-05-03 LAB — BASIC METABOLIC PANEL
Calcium, Total: 8.8 mg/dL (ref 8.5–10.1)
EGFR (African American): 40 — ABNORMAL LOW
EGFR (Non-African Amer.): 35 — ABNORMAL LOW
Glucose: 101 mg/dL — ABNORMAL HIGH (ref 65–99)
Osmolality: 279 (ref 275–301)

## 2012-05-04 ENCOUNTER — Ambulatory Visit (INDEPENDENT_AMBULATORY_CARE_PROVIDER_SITE_OTHER): Payer: Medicare Other

## 2012-05-04 DIAGNOSIS — Z7901 Long term (current) use of anticoagulants: Secondary | ICD-10-CM

## 2012-05-04 DIAGNOSIS — I4891 Unspecified atrial fibrillation: Secondary | ICD-10-CM

## 2012-05-17 ENCOUNTER — Ambulatory Visit: Payer: Self-pay | Admitting: Oncology

## 2012-05-31 LAB — CBC CANCER CENTER
Basophil #: 0 x10 3/mm (ref 0.0–0.1)
Basophil %: 0.6 %
Eosinophil #: 0.6 x10 3/mm (ref 0.0–0.7)
HCT: 39.3 % — ABNORMAL LOW (ref 40.0–52.0)
Lymphocyte %: 34.4 %
MCH: 28.6 pg (ref 26.0–34.0)
MCHC: 32.6 g/dL (ref 32.0–36.0)
Monocyte #: 1.1 x10 3/mm — ABNORMAL HIGH (ref 0.2–1.0)
Neutrophil #: 3.5 x10 3/mm (ref 1.4–6.5)
Neutrophil %: 44 %
Platelet: 129 x10 3/mm — ABNORMAL LOW (ref 150–440)
RDW: 14.3 % (ref 11.5–14.5)
WBC: 8 x10 3/mm (ref 3.8–10.6)

## 2012-05-31 LAB — BASIC METABOLIC PANEL
Anion Gap: 12 (ref 7–16)
BUN: 17 mg/dL (ref 7–18)
Chloride: 104 mmol/L (ref 98–107)
Creatinine: 1.5 mg/dL — ABNORMAL HIGH (ref 0.60–1.30)
EGFR (African American): 48 — ABNORMAL LOW
Potassium: 3.8 mmol/L (ref 3.5–5.1)
Sodium: 141 mmol/L (ref 136–145)

## 2012-06-01 ENCOUNTER — Ambulatory Visit (INDEPENDENT_AMBULATORY_CARE_PROVIDER_SITE_OTHER): Payer: Medicare Other

## 2012-06-01 DIAGNOSIS — Z7901 Long term (current) use of anticoagulants: Secondary | ICD-10-CM

## 2012-06-01 DIAGNOSIS — I4891 Unspecified atrial fibrillation: Secondary | ICD-10-CM

## 2012-06-01 LAB — POCT INR: INR: 3.3

## 2012-06-01 LAB — PROT IMMUNOELECTROPHORES(ARMC)

## 2012-06-07 ENCOUNTER — Telehealth: Payer: Self-pay | Admitting: Internal Medicine

## 2012-06-07 MED ORDER — ESCITALOPRAM OXALATE 20 MG PO TABS: 20.0000 mg | ORAL_TABLET | Freq: Every day | ORAL | Status: DC

## 2012-06-07 NOTE — Telephone Encounter (Signed)
Refill request for escitalopram 20 mg tablet Qty 30 Sig: take 1 tablet (20 mg total) by mouth daily

## 2012-06-07 NOTE — Telephone Encounter (Signed)
Refill for escitalopram 20 mg #30 6 R sent to [facility] pharmacy

## 2012-06-07 NOTE — Telephone Encounter (Signed)
Ok to refill   #6 refills

## 2012-06-17 ENCOUNTER — Ambulatory Visit: Payer: Self-pay | Admitting: Oncology

## 2012-06-22 ENCOUNTER — Ambulatory Visit (INDEPENDENT_AMBULATORY_CARE_PROVIDER_SITE_OTHER): Payer: Medicare Other

## 2012-06-22 DIAGNOSIS — Z7901 Long term (current) use of anticoagulants: Secondary | ICD-10-CM

## 2012-06-22 DIAGNOSIS — I4891 Unspecified atrial fibrillation: Secondary | ICD-10-CM

## 2012-06-22 LAB — POCT INR: INR: 2.3

## 2012-06-28 LAB — CBC CANCER CENTER
Basophil %: 0.3 %
Eosinophil #: 0.4 x10 3/mm (ref 0.0–0.7)
Eosinophil %: 6.4 %
HCT: 38.2 % — ABNORMAL LOW (ref 40.0–52.0)
HGB: 12.4 g/dL — ABNORMAL LOW (ref 13.0–18.0)
Lymphocyte %: 29.6 %
MCH: 28.1 pg (ref 26.0–34.0)
MCHC: 32.4 g/dL (ref 32.0–36.0)
MCV: 87 fL (ref 80–100)
Neutrophil #: 2.6 x10 3/mm (ref 1.4–6.5)
RBC: 4.41 10*6/uL (ref 4.40–5.90)
WBC: 6.1 x10 3/mm (ref 3.8–10.6)

## 2012-06-28 LAB — COMPREHENSIVE METABOLIC PANEL
Albumin: 3.4 g/dL (ref 3.4–5.0)
Alkaline Phosphatase: 84 U/L (ref 50–136)
BUN: 17 mg/dL (ref 7–18)
Bilirubin,Total: 0.3 mg/dL (ref 0.2–1.0)
Co2: 25 mmol/L (ref 21–32)
EGFR (Non-African Amer.): 39 — ABNORMAL LOW
Glucose: 105 mg/dL — ABNORMAL HIGH (ref 65–99)
Osmolality: 276 (ref 275–301)
SGOT(AST): 23 U/L (ref 15–37)
SGPT (ALT): 23 U/L (ref 12–78)
Sodium: 137 mmol/L (ref 136–145)
Total Protein: 7.6 g/dL (ref 6.4–8.2)

## 2012-07-17 ENCOUNTER — Ambulatory Visit: Payer: Self-pay | Admitting: Oncology

## 2012-07-20 ENCOUNTER — Ambulatory Visit (INDEPENDENT_AMBULATORY_CARE_PROVIDER_SITE_OTHER): Payer: Medicare Other

## 2012-07-20 DIAGNOSIS — Z7901 Long term (current) use of anticoagulants: Secondary | ICD-10-CM

## 2012-07-20 DIAGNOSIS — I4891 Unspecified atrial fibrillation: Secondary | ICD-10-CM

## 2012-07-20 LAB — POCT INR: INR: 3.1

## 2012-07-26 LAB — COMPREHENSIVE METABOLIC PANEL
Albumin: 3.5 g/dL (ref 3.4–5.0)
Anion Gap: 10 (ref 7–16)
BUN: 17 mg/dL (ref 7–18)
Bilirubin,Total: 0.5 mg/dL (ref 0.2–1.0)
Calcium, Total: 9 mg/dL (ref 8.5–10.1)
EGFR (African American): 46 — ABNORMAL LOW
EGFR (Non-African Amer.): 40 — ABNORMAL LOW
Glucose: 99 mg/dL (ref 65–99)
Osmolality: 281 (ref 275–301)
Potassium: 4 mmol/L (ref 3.5–5.1)
SGOT(AST): 20 U/L (ref 15–37)
SGPT (ALT): 23 U/L (ref 12–78)
Sodium: 140 mmol/L (ref 136–145)
Total Protein: 7.2 g/dL (ref 6.4–8.2)

## 2012-07-26 LAB — CBC CANCER CENTER
Basophil #: 0 x10 3/mm (ref 0.0–0.1)
Basophil %: 0.5 %
Eosinophil #: 0.6 x10 3/mm (ref 0.0–0.7)
Eosinophil %: 9 %
Lymphocyte #: 2.4 x10 3/mm (ref 1.0–3.6)
Lymphocyte %: 36.7 %
MCV: 85 fL (ref 80–100)
Neutrophil #: 2.3 x10 3/mm (ref 1.4–6.5)
RBC: 4.4 10*6/uL (ref 4.40–5.90)
RDW: 16.2 % — ABNORMAL HIGH (ref 11.5–14.5)
WBC: 6.4 x10 3/mm (ref 3.8–10.6)

## 2012-07-27 LAB — PROT IMMUNOELECT,UR-24HR

## 2012-07-27 LAB — PROT IMMUNOELECTROPHORES(ARMC)

## 2012-08-12 ENCOUNTER — Telehealth: Payer: Self-pay | Admitting: Internal Medicine

## 2012-08-12 DIAGNOSIS — N401 Enlarged prostate with lower urinary tract symptoms: Secondary | ICD-10-CM

## 2012-08-12 MED ORDER — FINASTERIDE 5 MG PO TABS: 5.0000 mg | ORAL_TABLET | Freq: Every day | ORAL | Status: DC

## 2012-08-12 NOTE — Telephone Encounter (Signed)
Refill on Finasteride 5 mg tablet 30.0 ea Prescribed refills 6 Date written 01/06/12  Last revill 07/15/12 Take 1 tablet every day [facility] [phone] f [phone]

## 2012-08-12 NOTE — Telephone Encounter (Signed)
Med filled.  

## 2012-08-17 ENCOUNTER — Ambulatory Visit: Payer: Self-pay | Admitting: Oncology

## 2012-08-18 ENCOUNTER — Ambulatory Visit (INDEPENDENT_AMBULATORY_CARE_PROVIDER_SITE_OTHER): Payer: Medicare Other

## 2012-08-18 DIAGNOSIS — Z7901 Long term (current) use of anticoagulants: Secondary | ICD-10-CM

## 2012-08-18 DIAGNOSIS — I4891 Unspecified atrial fibrillation: Secondary | ICD-10-CM

## 2012-08-18 LAB — POCT INR: INR: 2.1

## 2012-08-23 LAB — CBC CANCER CENTER
Basophil #: 0 x10 3/mm (ref 0.0–0.1)
HGB: 12.7 g/dL — ABNORMAL LOW (ref 13.0–18.0)
Lymphocyte %: 37.1 %
MCHC: 33.3 g/dL (ref 32.0–36.0)
MCV: 85 fL (ref 80–100)
Neutrophil #: 3 x10 3/mm (ref 1.4–6.5)
Platelet: 125 x10 3/mm — ABNORMAL LOW (ref 150–440)
WBC: 7.4 x10 3/mm (ref 3.8–10.6)

## 2012-08-23 LAB — COMPREHENSIVE METABOLIC PANEL
Anion Gap: 8 (ref 7–16)
Calcium, Total: 8.6 mg/dL (ref 8.5–10.1)
Chloride: 104 mmol/L (ref 98–107)
Co2: 28 mmol/L (ref 21–32)
Glucose: 96 mg/dL (ref 65–99)
Osmolality: 281 (ref 275–301)
Sodium: 140 mmol/L (ref 136–145)
Total Protein: 7.1 g/dL (ref 6.4–8.2)

## 2012-09-02 ENCOUNTER — Other Ambulatory Visit: Payer: Self-pay | Admitting: Internal Medicine

## 2012-09-02 NOTE — Telephone Encounter (Signed)
Med filled.  

## 2012-09-17 ENCOUNTER — Ambulatory Visit: Payer: Self-pay | Admitting: Oncology

## 2012-09-20 LAB — CBC CANCER CENTER
Basophil #: 0 x10 3/mm (ref 0.0–0.1)
Basophil %: 0.5 %
Eosinophil #: 0.4 x10 3/mm (ref 0.0–0.7)
Eosinophil %: 5.7 %
HCT: 37.6 % — ABNORMAL LOW (ref 40.0–52.0)
Lymphocyte #: 2.1 x10 3/mm (ref 1.0–3.6)
Lymphocyte %: 32.5 %
MCH: 28.7 pg (ref 26.0–34.0)
MCHC: 33.5 g/dL (ref 32.0–36.0)
MCV: 86 fL (ref 80–100)
Monocyte #: 1.1 x10 3/mm — ABNORMAL HIGH (ref 0.2–1.0)
Monocyte %: 17.2 %
Neutrophil #: 2.9 x10 3/mm (ref 1.4–6.5)
Platelet: 120 x10 3/mm — ABNORMAL LOW (ref 150–440)
RDW: 16.3 % — ABNORMAL HIGH (ref 11.5–14.5)
WBC: 6.6 x10 3/mm (ref 3.8–10.6)

## 2012-09-20 LAB — COMPREHENSIVE METABOLIC PANEL
Albumin: 3.5 g/dL (ref 3.4–5.0)
Alkaline Phosphatase: 68 U/L (ref 50–136)
Calcium, Total: 8.4 mg/dL — ABNORMAL LOW (ref 8.5–10.1)
Chloride: 104 mmol/L (ref 98–107)
Co2: 32 mmol/L (ref 21–32)
Creatinine: 1.43 mg/dL — ABNORMAL HIGH (ref 0.60–1.30)
Glucose: 106 mg/dL — ABNORMAL HIGH (ref 65–99)
Osmolality: 283 (ref 275–301)
SGPT (ALT): 29 U/L (ref 12–78)
Sodium: 141 mmol/L (ref 136–145)
Total Protein: 6.9 g/dL (ref 6.4–8.2)

## 2012-09-21 ENCOUNTER — Ambulatory Visit (INDEPENDENT_AMBULATORY_CARE_PROVIDER_SITE_OTHER): Payer: Medicare Other

## 2012-09-21 DIAGNOSIS — I4891 Unspecified atrial fibrillation: Secondary | ICD-10-CM

## 2012-09-21 DIAGNOSIS — Z7901 Long term (current) use of anticoagulants: Secondary | ICD-10-CM

## 2012-09-21 LAB — KAPPA/LAMBDA FREE LIGHT CHAINS (ARMC)

## 2012-09-21 LAB — POCT INR: INR: 2.3

## 2012-09-21 LAB — PROT IMMUNOELECTROPHORES(ARMC)

## 2012-09-28 ENCOUNTER — Other Ambulatory Visit: Payer: Self-pay | Admitting: Cardiology

## 2012-10-01 ENCOUNTER — Emergency Department: Payer: Self-pay | Admitting: Emergency Medicine

## 2012-10-01 ENCOUNTER — Other Ambulatory Visit: Payer: Self-pay

## 2012-10-01 LAB — BASIC METABOLIC PANEL
BUN: 19 mg/dL — ABNORMAL HIGH (ref 7–18)
Creatinine: 1.37 mg/dL — ABNORMAL HIGH (ref 0.60–1.30)
EGFR (African American): 53 — ABNORMAL LOW
EGFR (Non-African Amer.): 46 — ABNORMAL LOW
Glucose: 106 mg/dL — ABNORMAL HIGH (ref 65–99)
Osmolality: 280 (ref 275–301)
Potassium: 3.5 mmol/L (ref 3.5–5.1)
Sodium: 139 mmol/L (ref 136–145)

## 2012-10-01 LAB — CBC
HCT: 38.6 % — ABNORMAL LOW (ref 40.0–52.0)
MCHC: 32.8 g/dL (ref 32.0–36.0)
MCV: 87 fL (ref 80–100)
Platelet: 142 10*3/uL — ABNORMAL LOW (ref 150–440)
RDW: 16.6 % — ABNORMAL HIGH (ref 11.5–14.5)
WBC: 7 10*3/uL (ref 3.8–10.6)

## 2012-10-07 LAB — CULTURE, BLOOD (SINGLE)

## 2012-10-15 ENCOUNTER — Ambulatory Visit: Payer: Self-pay | Admitting: Oncology

## 2012-10-19 ENCOUNTER — Ambulatory Visit (INDEPENDENT_AMBULATORY_CARE_PROVIDER_SITE_OTHER): Payer: Medicare Other

## 2012-10-19 DIAGNOSIS — I4891 Unspecified atrial fibrillation: Secondary | ICD-10-CM

## 2012-10-19 DIAGNOSIS — Z7901 Long term (current) use of anticoagulants: Secondary | ICD-10-CM

## 2012-10-19 LAB — CBC CANCER CENTER
Basophil #: 0 x10 3/mm (ref 0.0–0.1)
Eosinophil #: 0.4 x10 3/mm (ref 0.0–0.7)
Eosinophil %: 7.8 %
HGB: 12.9 g/dL — ABNORMAL LOW (ref 13.0–18.0)
Lymphocyte %: 33.7 %
MCV: 86 fL (ref 80–100)
Monocyte #: 0.6 x10 3/mm (ref 0.2–1.0)
Monocyte %: 11.1 %
Platelet: 138 x10 3/mm — ABNORMAL LOW (ref 150–440)
RBC: 4.53 10*6/uL (ref 4.40–5.90)
RDW: 15.6 % — ABNORMAL HIGH (ref 11.5–14.5)
WBC: 5.5 x10 3/mm (ref 3.8–10.6)

## 2012-10-19 LAB — COMPREHENSIVE METABOLIC PANEL
Albumin: 3.1 g/dL — ABNORMAL LOW (ref 3.4–5.0)
Alkaline Phosphatase: 69 U/L (ref 50–136)
Bilirubin,Total: 0.5 mg/dL (ref 0.2–1.0)
EGFR (African American): 47 — ABNORMAL LOW
EGFR (Non-African Amer.): 40 — ABNORMAL LOW
Glucose: 113 mg/dL — ABNORMAL HIGH (ref 65–99)
Osmolality: 282 (ref 275–301)
SGPT (ALT): 22 U/L (ref 12–78)
Sodium: 140 mmol/L (ref 136–145)

## 2012-10-20 LAB — PROT IMMUNOELECTROPHORES(ARMC)

## 2012-10-20 LAB — KAPPA/LAMBDA FREE LIGHT CHAINS (ARMC)

## 2012-10-25 ENCOUNTER — Encounter: Payer: Self-pay | Admitting: Internal Medicine

## 2012-10-25 ENCOUNTER — Ambulatory Visit (INDEPENDENT_AMBULATORY_CARE_PROVIDER_SITE_OTHER): Payer: Medicare Other | Admitting: Internal Medicine

## 2012-10-25 VITALS — BP 124/64 | HR 62 | Temp 98.0°F | Resp 15 | Wt 175.5 lb

## 2012-10-25 DIAGNOSIS — M25512 Pain in left shoulder: Secondary | ICD-10-CM

## 2012-10-25 DIAGNOSIS — I10 Essential (primary) hypertension: Secondary | ICD-10-CM

## 2012-10-25 DIAGNOSIS — I4891 Unspecified atrial fibrillation: Secondary | ICD-10-CM

## 2012-10-25 MED ORDER — TETANUS-DIPHTH-ACELL PERTUSSIS 5-2.5-18.5 LF-MCG/0.5 IM SUSP: 0.5000 mL | Freq: Once | INTRAMUSCULAR | Status: DC

## 2012-10-25 NOTE — Patient Instructions (Addendum)
Ask Dr. Doylene Canning about whether he gave you a pneumonia vaccine  You should get a TDaP vaccine at a local pharmacy to protect your from tetanus and whooping cough   Ask Dr Jeanella Cara to check an LDL and a HgbA1c with your next lab draw

## 2012-10-25 NOTE — Progress Notes (Signed)
Patient ID: Billy Kane, male   DOB: Oct 29, 1922, 79 y.o.   MRN: [id number]   Patient Active Problem List  Diagnosis  . HYPERLIPIDEMIA-MIXED  . CORONARY ATHEROSCLEROSIS OF ARTERY BYPASS GRAFT  . CHEST PAIN UNSPECIFIED  . CORONARY ARTERY BYPASS GRAFT, HX OF  . ATRIAL FIBRILLATION  . CAROTID ARTERY STENOSIS, WITHOUT INFARCTION  . Long term current use of anticoagulant  . Multiple myeloma  . Hypertension  . Hyperlipidemia  . Chronic kidney disease  . PUD (peptic ulcer disease)  . Bronchitis  . Chronic back pain  . Obesity (BMI 30-39.9)  . Shoulder pain, left    Subjective:  CC:   Chief Complaint  Patient presents with  . Follow-up    6 month    HPI:   Billy Kane a 79 y.o. male who presents for 6 month follow up on multiple chronic issues including depression, hyperlipidemia, hypertension, and multiple myeloma. He has chronic back and shoulder pain which is currently under control with use of a Fentanyl patch prescribed by Dr. Virl Diamond C. and recently increased in strength. He denies any constipation and no mental status changes. He deferred orthopedic evaluation shoulder since the Duragesic patch and has been alleviating his pain.  he is mildly restricted range of motion with left shoulder but is managing well. He has not had any falls recently. He is accompanied today by his caregiver and his son. He feels generally well.    Past Medical History  Diagnosis Date  . Coronary artery disease   . Depression   . TIA (transient ischemic attack)   . CAD (coronary artery disease)     s/p AMI 1998  . Lymphocytic leukemia 12/10    small, diagnosed 12/10  . Multiple myeloma(203.0)     diagnosed with monclonal spike fo 5 gm  . S/P coronary artery bypass graft x 4 1998    [facility], SVG to distal RCA, OM1 D1; LIMA to distal LAD  . Hypertension   . Hyperlipidemia   . Chronic kidney disease     Stage III, secondary to myeloma  . PUD (peptic ulcer disease)     Past Surgical History   Procedure Laterality Date  . Cholecystectomy    . Cholecystectomy  > 20 years ago  . Coronary artery bypass graft       The following portions of the patient's history were reviewed and updated as appropriate: Allergies, current medications, and problem list.    Review of Systems:   Patient denies headache, fevers, malaise, unintentional weight loss, skin rash, eye pain, sinus congestion and sinus pain, sore throat, dysphagia,  hemoptysis , cough, dyspnea, wheezing, chest pain, palpitations, orthopnea, edema, abdominal pain, nausea, melena, diarrhea, constipation, flank pain, dysuria, hematuria, urinary  Frequency, nocturia, numbness, tingling, seizures,  Focal weakness, Loss of consciousness,  Tremor, insomnia, depression, anxiety, and suicidal ideation.     History   Social History  . Marital Status: Widowed    Spouse Name: N/A    Number of Children: N/A  . Years of Education: N/A   Occupational History  . Not on file.   Social History Main Topics  . Smoking status: Never Smoker   . Smokeless tobacco: Never Used  . Alcohol Use: No  . Drug Use: No  . Sexually Active:    Other Topics Concern  . Not on file   Social History Narrative   ** Merged History Encounter **        Objective:  BP 124/64  Pulse 62  Temp(Src) 98 F (36.7 C) (Oral)  Resp 15  Wt 175 lb 8 oz (79.606 kg)  BMI 28.34 kg/m2  SpO2 97%  General appearance: alert, cooperative and appears stated age Ears: normal TM's and external ear canals both ears Throat: lips, mucosa, and tongue normal; teeth and gums normal Neck: no adenopathy, no carotid bruit, supple, symmetrical, trachea midline and thyroid not enlarged, symmetric, no tenderness/mass/nodules Back: symmetric, no curvature. ROM normal. No CVA tenderness. Lungs: clear to auscultation bilaterally Heart: regular rate and rhythm, S1, S2 normal, no murmur, click, rub or gallop Abdomen: soft, non-tender; bowel sounds normal; no masses,  no  organomegaly Pulses: 2+ and symmetric Skin: Skin color, texture, turgor normal. No rashes or lesions Lymph nodes: Cervical, supraclavicular, and axillary nodes normal.  Assessment and Plan:  Shoulder pain, left Probable adhesive capsulitis. However patient has deferred orthopedic evaluation since his pain is controlled on fentanyl patch. No further workup at this time.  Multiple myeloma Currently under management by Dr. Doylene Canning. Patient has not had a recent progression.  Hypertension Well controlled on current regimen. Renal function stable, no changes today.  ATRIAL FIBRILLATION Currently rate controlled. On chronic anticoagulation for medication of stroke risk.   Updated Medication List Outpatient Encounter Prescriptions as of 10/25/2012  Medication Sig Dispense Refill  . aspirin 81 MG EC tablet Take 81 mg by mouth daily.        Marland Kitchen atorvastatin (LIPITOR) 20 MG tablet TAKE 1 TABLET BY MOUTH DAILY.  90 tablet  1  . budesonide-formoterol (SYMBICORT) 160-4.5 MCG/ACT inhaler Inhale 1 puff into the lungs 2 (two) times daily as needed.        . Cholecalciferol (VITAMIN D-3) 1000 UNITS CAPS Take 1 capsule by mouth daily.      . diphenhydrAMINE (BENADRYL) 25 MG tablet Take 25 mg by mouth at bedtime.      . donepezil (ARICEPT) 5 MG tablet Take 1 tablet (5 mg total) by mouth daily.  90 tablet  5  . enalapril (VASOTEC) 2.5 MG tablet Take 2.5 mg by mouth daily.      Marland Kitchen escitalopram (LEXAPRO) 20 MG tablet Take 1 tablet (20 mg total) by mouth daily.  30 tablet  6  . fentaNYL (DURAGESIC - DOSED MCG/HR) 12 MCG/HR Place 1 patch onto the skin every other day.       . finasteride (PROSCAR) 5 MG tablet Take 1 tablet (5 mg total) by mouth daily.  90 tablet  3  . guaiFENesin (MUCINEX) 600 MG 12 hr tablet Take 1,200 mg by mouth 2 (two) times daily.      . Lenalidomide (REVLIMID) 5 MG CAPS Take 1 capsule by mouth as directed. 1 capsule for 21 days then 7 days off then repeat       . methocarbamol (ROBAXIN)  500 MG tablet Take 1/2 to 1 tablet by mouth every 12 hours, as needed       . metoprolol tartrate (LOPRESSOR) 25 MG tablet Take 0.5 tablets (12.5 mg total) by mouth 2 (two) times daily.  90 tablet  5  . Multiple Vitamin (MULTIVITAMIN) tablet Take 1 tablet by mouth daily.      . Tamsulosin HCl (FLOMAX) 0.4 MG CAPS TAKE 1 CAPSULE EVERY DAY  30 capsule  12  . warfarin (COUMADIN) 5 MG tablet 7 mg Tues Fri, 5 mg all other days      . acetaminophen (TYLENOL) 500 MG tablet Take 500 mg by mouth 2 (two) times daily.        Marland Kitchen  Docusate Sodium (STOOL SOFTENER) 100 MG capsule Take 100 mg by mouth 2 (two) times daily as needed.        . TDaP (BOOSTRIX) 5-2.5-18.5 LF-MCG/0.5 injection Inject 0.5 mLs into the muscle once.  0.5 mL  0   No facility-administered encounter medications on file as of 10/25/2012.     No orders of the defined types were placed in this encounter.    Return in about 6 months (around 04/27/2013).

## 2012-10-27 ENCOUNTER — Encounter: Payer: Self-pay | Admitting: Internal Medicine

## 2012-10-27 NOTE — Assessment & Plan Note (Signed)
Currently under management by Dr. Doylene Canning. Patient has not had a recent progression.

## 2012-10-27 NOTE — Assessment & Plan Note (Signed)
Currently rate controlled. On chronic anticoagulation for medication of stroke risk.

## 2012-10-27 NOTE — Assessment & Plan Note (Signed)
Well controlled on current regimen. Renal function stable, no changes today. 

## 2012-10-27 NOTE — Assessment & Plan Note (Signed)
Probable adhesive capsulitis. However patient has deferred orthopedic evaluation since his pain is controlled on fentanyl patch. No further workup at this time.

## 2012-11-03 ENCOUNTER — Ambulatory Visit (INDEPENDENT_AMBULATORY_CARE_PROVIDER_SITE_OTHER): Payer: Medicare Other

## 2012-11-03 ENCOUNTER — Encounter: Payer: Self-pay | Admitting: Cardiology

## 2012-11-03 ENCOUNTER — Ambulatory Visit (INDEPENDENT_AMBULATORY_CARE_PROVIDER_SITE_OTHER): Payer: Medicare Other | Admitting: Cardiology

## 2012-11-03 VITALS — BP 122/68 | HR 56 | Ht 66.0 in | Wt 187.0 lb

## 2012-11-03 DIAGNOSIS — I4891 Unspecified atrial fibrillation: Secondary | ICD-10-CM

## 2012-11-03 DIAGNOSIS — Z7901 Long term (current) use of anticoagulants: Secondary | ICD-10-CM

## 2012-11-03 NOTE — Patient Instructions (Addendum)
Your physician recommends that you continue on your current medications as directed. Please refer to the Current Medication list given to you today.  Your physician wants you to follow-up in: 1 year. You will receive a reminder letter in the mail two months in advance. If you don't receive a letter, please call our office to schedule the follow-up appointment.  

## 2012-11-03 NOTE — Assessment & Plan Note (Signed)
Asymptomatic. Continue medical therapy.

## 2012-11-03 NOTE — Progress Notes (Signed)
HPI Billy Kane returns today for evaluation and management of his coronary artery disease, history of myocardial infarction, and paroxysmal A. fib. He's also going bypass surgery at [facility].  Since I last saw him, he is doing remarkably well. He denies any chest pain, angina, palpitations, presyncope or syncope. He has had no falls and he denies any bleeding.  Past Medical History  Diagnosis Date  . Coronary artery disease   . Depression   . TIA (transient ischemic attack)   . CAD (coronary artery disease)     s/p AMI 1998  . Lymphocytic leukemia 12/10    small, diagnosed 12/10  . Multiple myeloma(203.0)     diagnosed with monclonal spike fo 5 gm  . S/P coronary artery bypass graft x 4 1998    [facility], SVG to distal RCA, OM1 D1; LIMA to distal LAD  . Hypertension   . Hyperlipidemia   . Chronic kidney disease     Stage III, secondary to myeloma  . PUD (peptic ulcer disease)     Current Outpatient Prescriptions  Medication Sig Dispense Refill  . acetaminophen (TYLENOL) 500 MG tablet Take 500 mg by mouth 2 (two) times daily.        Marland Kitchen aspirin 81 MG EC tablet Take 81 mg by mouth daily.        Marland Kitchen atorvastatin (LIPITOR) 20 MG tablet TAKE 1 TABLET BY MOUTH DAILY.  90 tablet  1  . budesonide-formoterol (SYMBICORT) 160-4.5 MCG/ACT inhaler Inhale 1 puff into the lungs 2 (two) times daily as needed.        . Cholecalciferol (VITAMIN D-3) 1000 UNITS CAPS Take 1 capsule by mouth daily.      . diphenhydrAMINE (BENADRYL) 25 MG tablet Take 25 mg by mouth at bedtime.      [name] Sodium (STOOL SOFTENER) 100 MG capsule Take 100 mg by mouth 2 (two) times daily as needed.        . donepezil (ARICEPT) 5 MG tablet Take 1 tablet (5 mg total) by mouth daily.  90 tablet  5  . enalapril (VASOTEC) 2.5 MG tablet Take 2.5 mg by mouth daily.      Marland Kitchen escitalopram (LEXAPRO) 20 MG tablet Take 1 tablet (20 mg total) by mouth daily.  30 tablet  6  . fentaNYL (DURAGESIC - DOSED MCG/HR) 12 MCG/HR Place 1 patch onto the skin  every other day.       . finasteride (PROSCAR) 5 MG tablet Take 1 tablet (5 mg total) by mouth daily.  90 tablet  3  . guaiFENesin (MUCINEX) 600 MG 12 hr tablet Take 1,200 mg by mouth 2 (two) times daily.      . Lenalidomide (REVLIMID) 5 MG CAPS Take 1 capsule by mouth as directed. 1 capsule for 21 days then 7 days off then repeat       . methocarbamol (ROBAXIN) 500 MG tablet Take 1/2 to 1 tablet by mouth every 12 hours, as needed       . metoprolol tartrate (LOPRESSOR) 25 MG tablet Take 0.5 tablets (12.5 mg total) by mouth 2 (two) times daily.  90 tablet  5  . Multiple Vitamin (MULTIVITAMIN) tablet Take 1 tablet by mouth daily.      . Tamsulosin HCl (FLOMAX) 0.4 MG CAPS TAKE 1 CAPSULE EVERY DAY  30 capsule  12  . TDaP (BOOSTRIX) 5-2.5-18.5 LF-MCG/0.5 injection Inject 0.5 mLs into the muscle once.  0.5 mL  0  . warfarin (COUMADIN) 5 MG tablet 7 mg  Tues Fri, 5 mg all other days       No current facility-administered medications for this visit.    No Known Allergies  Family History  Problem Relation Age of Onset  . Coronary artery disease Father   . Heart attack Father 33  . Coronary artery disease Brother   . Heart disease Mother     CAD   . Cancer Brother     History   Social History  . Marital Status: Widowed    Spouse Name: N/A    Number of Children: N/A  . Years of Education: N/A   Occupational History  . Not on file.   Social History Main Topics  . Smoking status: Never Smoker   . Smokeless tobacco: Never Used  . Alcohol Use: No  . Drug Use: No  . Sexually Active:    Other Topics Concern  . Not on file   Social History Narrative   ** Merged History Encounter **        ROS ALL NEGATIVE EXCEPT THOSE NOTED IN HPI  PE  General Appearance: well developed, well nourished in no acute distress, looks younger than stated age HEENT: symmetrical face, PERRLA, good dentition  Neck: no JVD, thyromegaly, or adenopathy, trachea midline Chest: symmetric without  deformity Cardiac: PMI non-displaced, RRR, normal S1, S2, no gallop soft systolic murmur at the apex, S2 splits Lung: clear to ausculation and percussion Vascular: all pulses full without bruits  Abdominal: nondistended, nontender, good bowel sounds, no HSM, no bruits Extremities: no cyanosis, clubbing or edema, no sign of DVT, no varicosities  Skin: normal color, no rashes Neuro: alert and oriented x 3, non-focal Pysch: normal affect  EKG  BMET    Component Value Date/Time   NA 140 04/24/2011 1148   K 4.5 04/24/2011 1148   CL 104 04/24/2011 1148   CO2 31 04/24/2011 1148   GLUCOSE 102* 04/24/2011 1148   BUN 21 04/24/2011 1148   CREATININE 1.5 04/24/2011 1148   CALCIUM 9.1 04/24/2011 1148   GFRNONAA 50 04/17/2009 0954    Lipid Panel     Component Value Date/Time   CHOL 179 10/25/2008   TRIG 195 10/25/2008   HDL 42 10/25/2008   VLDL 39 10/25/2008   LDLCALC 98 10/25/2008    CBC    Component Value Date/Time   WBC 10.3 04/24/2011 1148   RBC 4.06* 04/24/2011 1148   HGB 11.9* 04/24/2011 1148   HCT 36.0* 04/24/2011 1148   PLT 181.0 04/24/2011 1148   MCV 88.7 04/24/2011 1148   MCHC 33.1 04/24/2011 1148   RDW 14.1 04/24/2011 1148   LYMPHSABS 3.1 04/24/2011 1148   MONOABS 1.2* 04/24/2011 1148   EOSABS 0.3 04/24/2011 1148   BASOSABS 0.0 04/24/2011 1148

## 2012-11-03 NOTE — Assessment & Plan Note (Signed)
He is in sinus rhythm today by exam. Continue current medical therapy.

## 2012-11-03 NOTE — Assessment & Plan Note (Signed)
Doing well. Continue secondary preventative therapy.

## 2012-11-08 LAB — HEMOGLOBIN A1C: Hgb A1c MFr Bld: 5.6 % (ref 4.0–6.0)

## 2012-11-08 LAB — LIPID PANEL
HDL: 51 mg/dL (ref 35–70)
LDL Cholesterol: 58 mg/dL

## 2012-11-08 LAB — BASIC METABOLIC PANEL
Creatinine: 1.7 mg/dL — AB (ref 0.6–1.3)
Potassium: 4.6 mmol/L (ref 3.4–5.3)

## 2012-11-15 ENCOUNTER — Ambulatory Visit: Payer: Self-pay | Admitting: Oncology

## 2012-11-16 LAB — CBC CANCER CENTER
Basophil %: 0.7 %
Eosinophil #: 0.4 x10 3/mm (ref 0.0–0.7)
Eosinophil %: 5.2 %
HCT: 38.1 % — ABNORMAL LOW (ref 40.0–52.0)
HGB: 12.4 g/dL — ABNORMAL LOW (ref 13.0–18.0)
Lymphocyte #: 2.2 x10 3/mm (ref 1.0–3.6)
MCHC: 32.5 g/dL (ref 32.0–36.0)
MCV: 86 fL (ref 80–100)
Monocyte #: 1.3 x10 3/mm — ABNORMAL HIGH (ref 0.2–1.0)
Monocyte %: 18.3 %
Neutrophil %: 43.8 %
Platelet: 139 x10 3/mm — ABNORMAL LOW (ref 150–440)
RDW: 16.2 % — ABNORMAL HIGH (ref 11.5–14.5)

## 2012-11-16 LAB — COMPREHENSIVE METABOLIC PANEL
Alkaline Phosphatase: 64 U/L (ref 50–136)
Anion Gap: 5 — ABNORMAL LOW (ref 7–16)
BUN: 15 mg/dL (ref 7–18)
Co2: 32 mmol/L (ref 21–32)
Creatinine: 1.53 mg/dL — ABNORMAL HIGH (ref 0.60–1.30)
EGFR (African American): 46 — ABNORMAL LOW
EGFR (Non-African Amer.): 40 — ABNORMAL LOW
Glucose: 90 mg/dL (ref 65–99)
Potassium: 4.1 mmol/L (ref 3.5–5.1)
SGOT(AST): 21 U/L (ref 15–37)
Sodium: 141 mmol/L (ref 136–145)

## 2012-11-17 LAB — PROT IMMUNOELECTROPHORES(ARMC)

## 2012-11-17 LAB — KAPPA/LAMBDA FREE LIGHT CHAINS (ARMC)

## 2012-11-30 ENCOUNTER — Ambulatory Visit (INDEPENDENT_AMBULATORY_CARE_PROVIDER_SITE_OTHER): Payer: Medicare Other

## 2012-11-30 DIAGNOSIS — I4891 Unspecified atrial fibrillation: Secondary | ICD-10-CM

## 2012-11-30 DIAGNOSIS — Z7901 Long term (current) use of anticoagulants: Secondary | ICD-10-CM

## 2012-12-14 LAB — CBC CANCER CENTER
Basophil %: 0.6 %
Eosinophil #: 0.3 x10 3/mm (ref 0.0–0.7)
HGB: 12.3 g/dL — ABNORMAL LOW (ref 13.0–18.0)
Lymphocyte #: 2.1 x10 3/mm (ref 1.0–3.6)
Lymphocyte %: 35.2 %
MCH: 27.9 pg (ref 26.0–34.0)
MCHC: 32.6 g/dL (ref 32.0–36.0)
MCV: 86 fL (ref 80–100)
Neutrophil %: 42.4 %
RBC: 4.41 10*6/uL (ref 4.40–5.90)
RDW: 16.8 % — ABNORMAL HIGH (ref 11.5–14.5)
WBC: 5.9 x10 3/mm (ref 3.8–10.6)

## 2012-12-14 LAB — COMPREHENSIVE METABOLIC PANEL
Anion Gap: 6 — ABNORMAL LOW (ref 7–16)
BUN: 17 mg/dL (ref 7–18)
Bilirubin,Total: 0.6 mg/dL (ref 0.2–1.0)
Calcium, Total: 8.8 mg/dL (ref 8.5–10.1)
Chloride: 104 mmol/L (ref 98–107)
Co2: 30 mmol/L (ref 21–32)
Creatinine: 1.45 mg/dL — ABNORMAL HIGH (ref 0.60–1.30)
EGFR (Non-African Amer.): 43 — ABNORMAL LOW
Potassium: 4.2 mmol/L (ref 3.5–5.1)
SGOT(AST): 21 U/L (ref 15–37)
Sodium: 140 mmol/L (ref 136–145)
Total Protein: 6.3 g/dL — ABNORMAL LOW (ref 6.4–8.2)

## 2012-12-15 ENCOUNTER — Ambulatory Visit: Payer: Self-pay | Admitting: Oncology

## 2012-12-19 LAB — PROT IMMUNOELECTROPHORES(ARMC)

## 2012-12-28 ENCOUNTER — Ambulatory Visit (INDEPENDENT_AMBULATORY_CARE_PROVIDER_SITE_OTHER): Payer: Medicare Other

## 2012-12-28 DIAGNOSIS — I4891 Unspecified atrial fibrillation: Secondary | ICD-10-CM

## 2012-12-28 DIAGNOSIS — Z7901 Long term (current) use of anticoagulants: Secondary | ICD-10-CM

## 2012-12-28 LAB — POCT INR: INR: 2.5

## 2013-01-11 LAB — COMPREHENSIVE METABOLIC PANEL
Albumin: 3.3 g/dL — ABNORMAL LOW (ref 3.4–5.0)
Alkaline Phosphatase: 79 U/L (ref 50–136)
Anion Gap: 8 (ref 7–16)
BUN: 16 mg/dL (ref 7–18)
Bilirubin,Total: 0.5 mg/dL (ref 0.2–1.0)
Calcium, Total: 8.6 mg/dL (ref 8.5–10.1)
Chloride: 104 mmol/L (ref 98–107)
Co2: 28 mmol/L (ref 21–32)
EGFR (African American): 45 — ABNORMAL LOW
EGFR (Non-African Amer.): 39 — ABNORMAL LOW
Osmolality: 281 (ref 275–301)
SGOT(AST): 24 U/L (ref 15–37)
SGPT (ALT): 27 U/L (ref 12–78)
Sodium: 140 mmol/L (ref 136–145)
Total Protein: 6.4 g/dL (ref 6.4–8.2)

## 2013-01-11 LAB — CBC CANCER CENTER
Basophil #: 0 "x10 3/mm "
Basophil %: 0.5 %
Eosinophil #: 0.4 "x10 3/mm "
Eosinophil %: 5.8 %
HCT: 37.6 % — ABNORMAL LOW
HGB: 12.4 g/dL — ABNORMAL LOW
Lymphocyte %: 34.1 %
Lymphs Abs: 2.2 "x10 3/mm "
MCH: 28.5 pg
MCHC: 33.1 g/dL
MCV: 86 fL
Monocyte #: 1.1 "x10 3/mm " — ABNORMAL HIGH
Monocyte %: 17.2 %
Neutrophil #: 2.7 "x10 3/mm "
Neutrophil %: 42.4 %
Platelet: 124 "x10 3/mm " — ABNORMAL LOW
RBC: 4.37 "x10 6/mm " — ABNORMAL LOW
RDW: 16 % — ABNORMAL HIGH
WBC: 6.4 "x10 3/mm "

## 2013-01-14 ENCOUNTER — Other Ambulatory Visit: Payer: Self-pay | Admitting: Internal Medicine

## 2013-01-15 ENCOUNTER — Ambulatory Visit: Payer: Self-pay | Admitting: Oncology

## 2013-01-25 ENCOUNTER — Ambulatory Visit (INDEPENDENT_AMBULATORY_CARE_PROVIDER_SITE_OTHER): Payer: Medicare Other

## 2013-01-25 DIAGNOSIS — I4891 Unspecified atrial fibrillation: Secondary | ICD-10-CM

## 2013-01-25 DIAGNOSIS — Z7901 Long term (current) use of anticoagulants: Secondary | ICD-10-CM

## 2013-02-21 ENCOUNTER — Other Ambulatory Visit: Payer: Self-pay | Admitting: Internal Medicine

## 2013-02-22 ENCOUNTER — Ambulatory Visit (INDEPENDENT_AMBULATORY_CARE_PROVIDER_SITE_OTHER): Payer: Self-pay

## 2013-02-22 DIAGNOSIS — Z7901 Long term (current) use of anticoagulants: Secondary | ICD-10-CM

## 2013-02-22 DIAGNOSIS — I4891 Unspecified atrial fibrillation: Secondary | ICD-10-CM

## 2013-02-28 ENCOUNTER — Ambulatory Visit: Payer: Self-pay | Admitting: Oncology

## 2013-03-01 LAB — CBC CANCER CENTER
Basophil #: 0 x10 3/mm (ref 0.0–0.1)
Eosinophil #: 0.3 x10 3/mm (ref 0.0–0.7)
Eosinophil %: 3.5 %
HCT: 38.3 % — ABNORMAL LOW (ref 40.0–52.0)
HGB: 12.8 g/dL — ABNORMAL LOW (ref 13.0–18.0)
Lymphocyte #: 2.3 x10 3/mm (ref 1.0–3.6)
MCH: 28.5 pg (ref 26.0–34.0)
MCV: 85 fL (ref 80–100)
Monocyte #: 1.3 x10 3/mm — ABNORMAL HIGH (ref 0.2–1.0)
Neutrophil %: 50.2 %
Platelet: 133 x10 3/mm — ABNORMAL LOW (ref 150–440)
WBC: 7.9 x10 3/mm (ref 3.8–10.6)

## 2013-03-01 LAB — COMPREHENSIVE METABOLIC PANEL
Albumin: 3.6 g/dL (ref 3.4–5.0)
Alkaline Phosphatase: 70 U/L (ref 50–136)
Bilirubin,Total: 0.6 mg/dL (ref 0.2–1.0)
Calcium, Total: 8.6 mg/dL (ref 8.5–10.1)
Creatinine: 1.51 mg/dL — ABNORMAL HIGH (ref 0.60–1.30)
EGFR (Non-African Amer.): 41 — ABNORMAL LOW
Potassium: 4.3 mmol/L (ref 3.5–5.1)
SGOT(AST): 22 U/L (ref 15–37)
SGPT (ALT): 25 U/L (ref 12–78)

## 2013-03-15 ENCOUNTER — Other Ambulatory Visit: Payer: Self-pay | Admitting: Cardiology

## 2013-03-15 ENCOUNTER — Ambulatory Visit (INDEPENDENT_AMBULATORY_CARE_PROVIDER_SITE_OTHER): Payer: Self-pay | Admitting: *Deleted

## 2013-03-15 DIAGNOSIS — Z7901 Long term (current) use of anticoagulants: Secondary | ICD-10-CM

## 2013-03-15 DIAGNOSIS — I4891 Unspecified atrial fibrillation: Secondary | ICD-10-CM

## 2013-03-15 LAB — POCT INR: INR: 2

## 2013-03-17 ENCOUNTER — Ambulatory Visit: Payer: Self-pay | Admitting: Oncology

## 2013-03-21 IMAGING — US US PELVIS LIMITED
1 series · 17 of 25 positions shown · non-contrast
Comparison: none

REASON FOR EXAM: swelling in testicles
COMMENTS:

[Series 1: us pelvis limited · 17 of 82 slices shown]
[im 1/82]
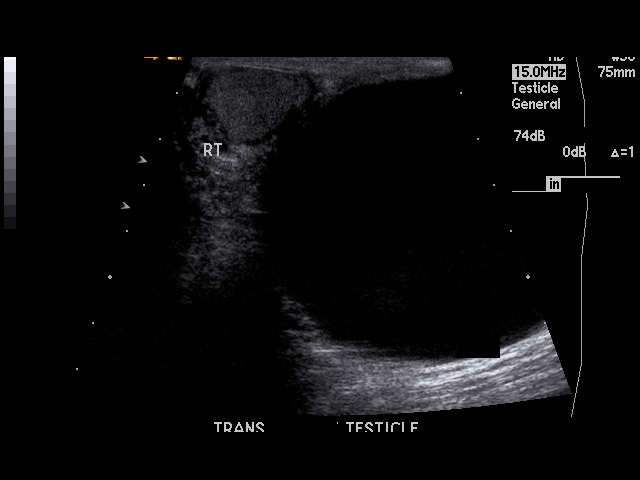
[im 7/82]
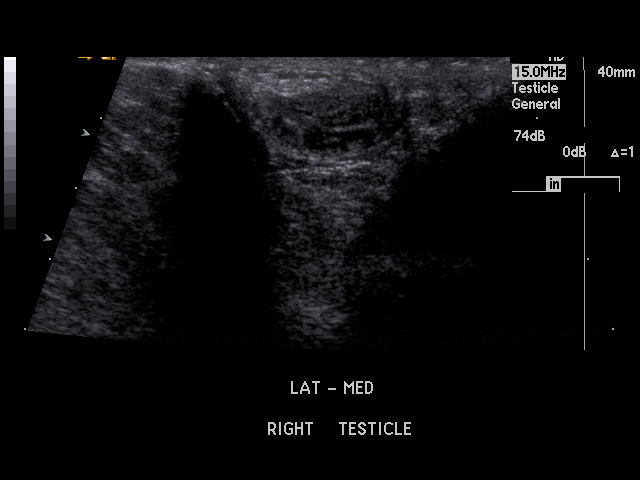
[im 11/82]
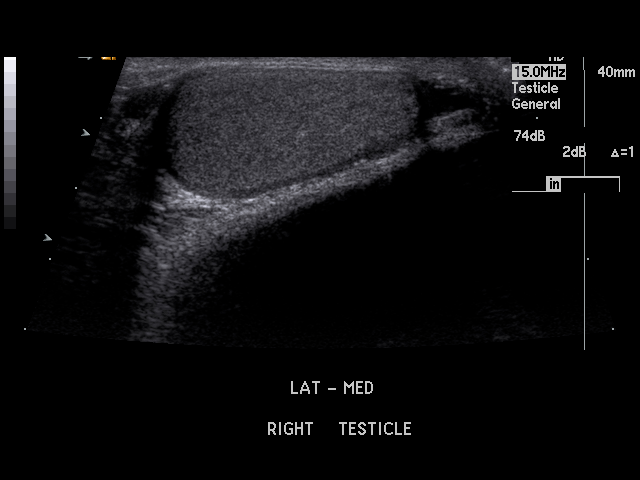
[im 17/82]
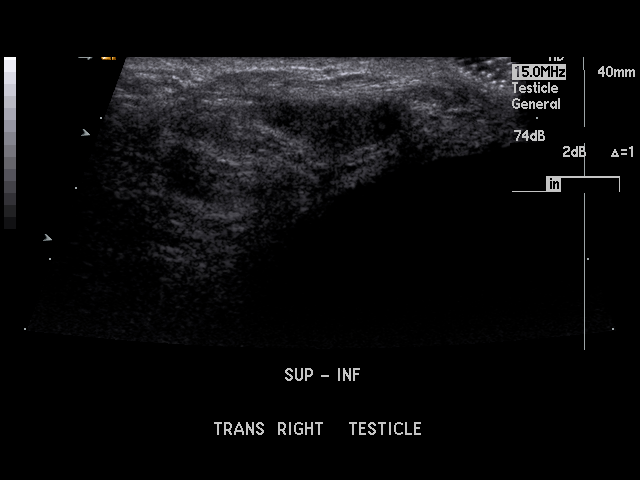
[im 21/82]
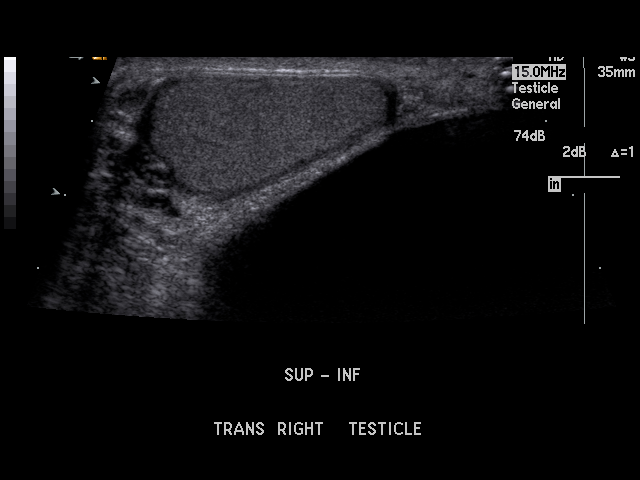
[im 28/82]
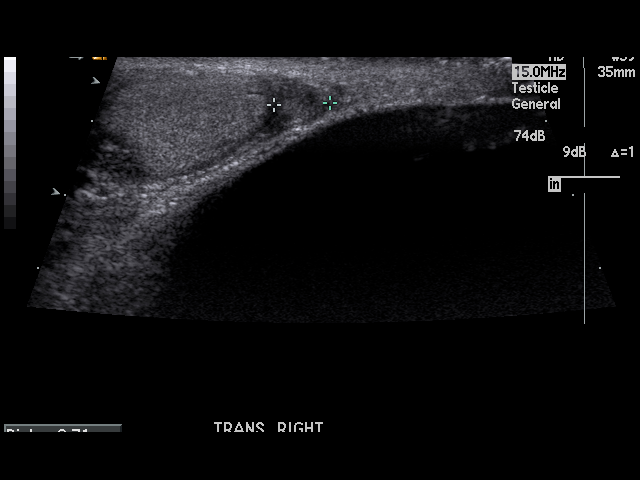
[im 31/82]
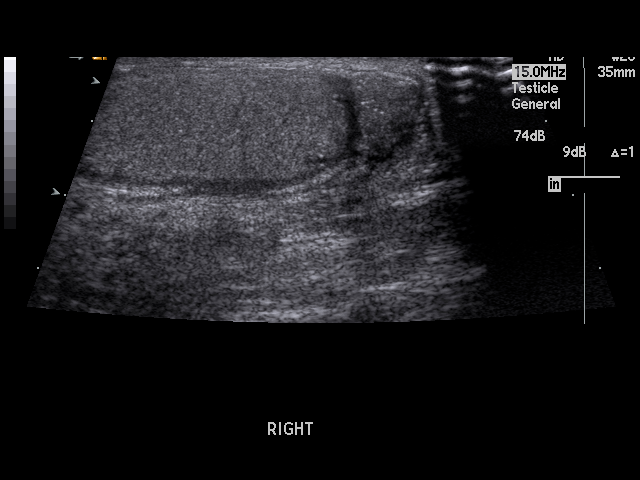
[im 38/82]
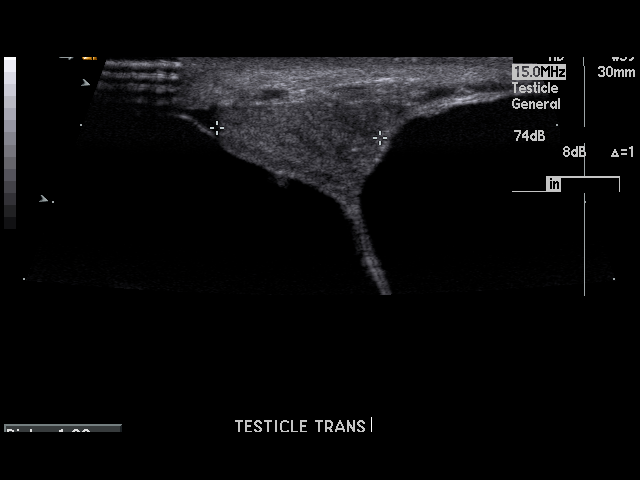
[im 41/82]
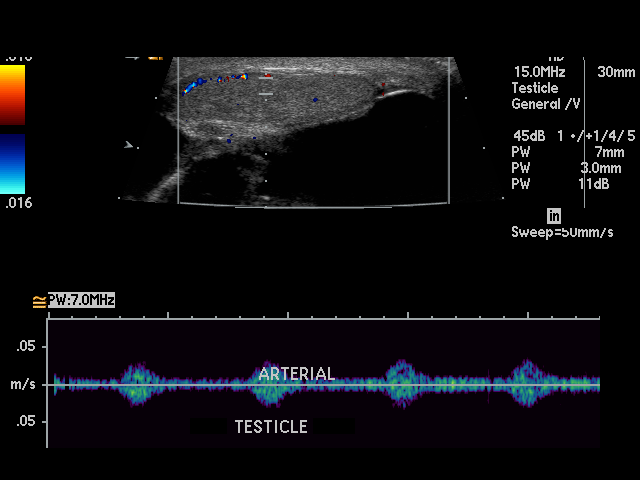
[im 44/82]
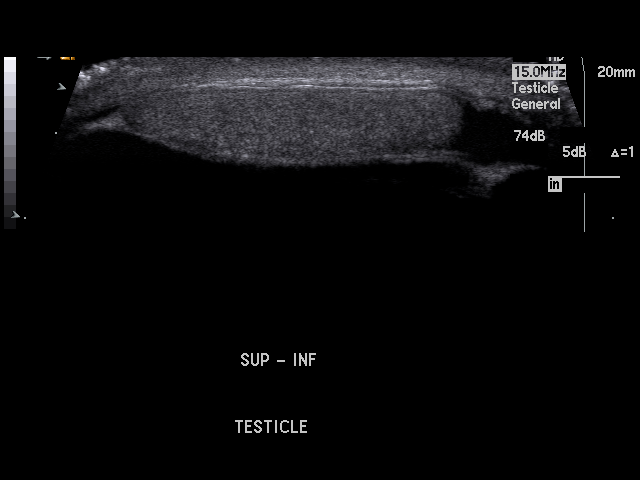
[im 51/82]
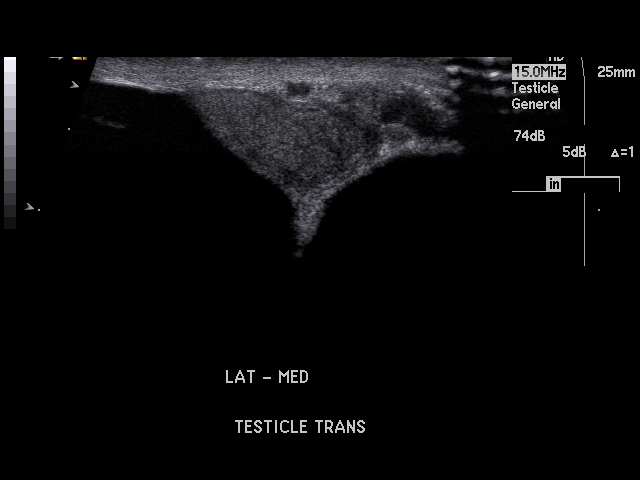
[im 55/82]
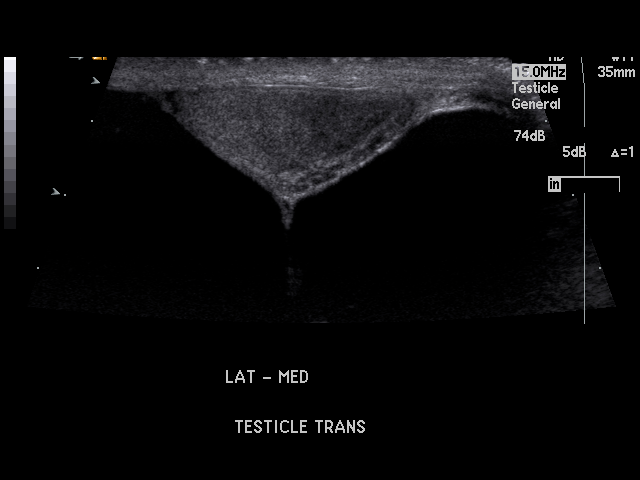
[im 61/82]
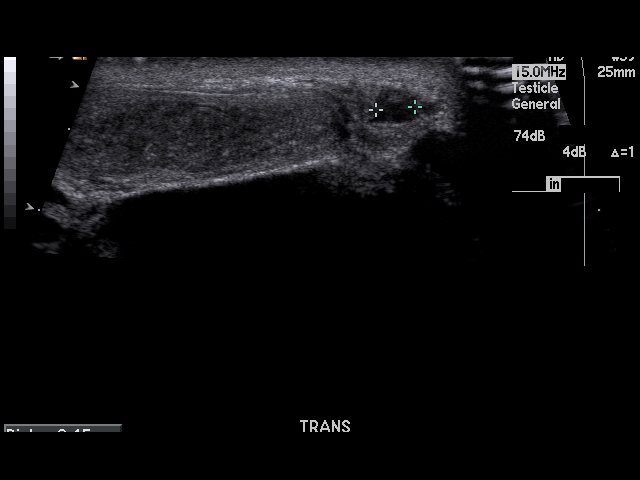
[im 65/82]
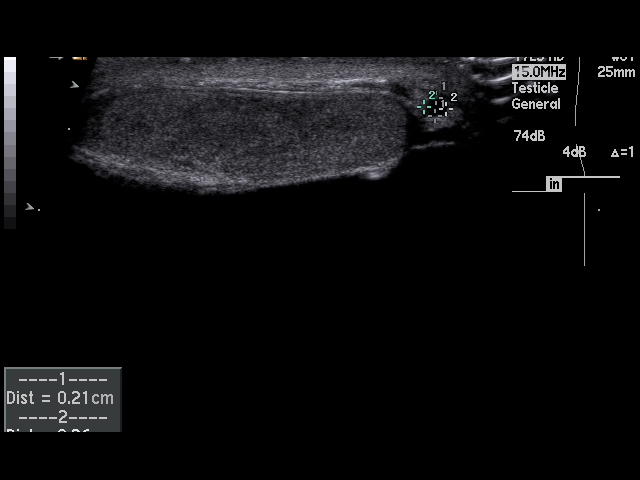
[im 71/82]
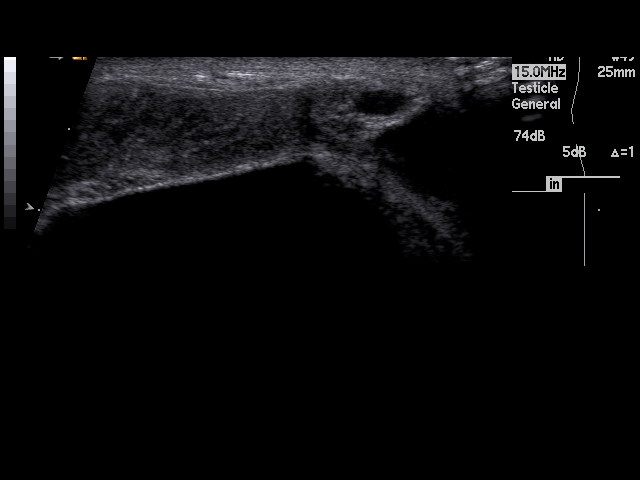
[im 75/82]
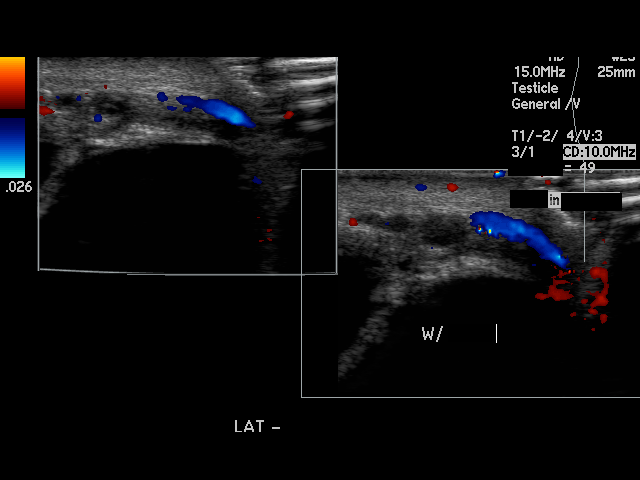
[im 82/82]
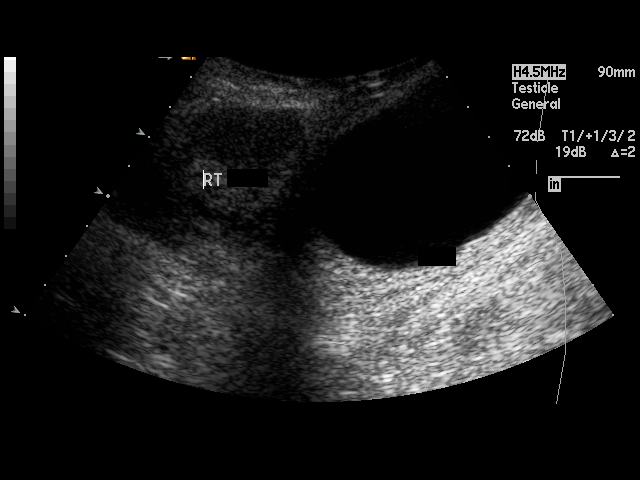

[17 of 25 positions shown; findings below may reference images not displayed]

PROCEDURE:     ARVAR - ARVAR TESTICULAR  - March 17, 2011 [DATE]

RESULT:     The right testicle measures 3.63 cm x 2.88 cm x 1.6 cm and the
left testicle measures 3.81 cm x 1.99 cm x 1.02 cm. No intratesticular mass
lesions are seen. Vascular flow is seen in each testicle with no torsion
identified. The epididymis bilaterally is normal in size. There are
incidentally noted two, small cysts of the left epididymis with the larger
measuring 9.5 mm at maximum diameter and the smaller measuring 5.1 mm at
maximum diameter. There is again noted a large septated mass in the left
scrotum. The mass measures 13.12 cm x 10.81 cm x 8.44 cm. The finding is
similar to that noted on the prior exam of November 26, 2009. The mass is noted
to extrinsically compress and deform the left testicle but as mentioned,
vascular flow remains present. The septated cystic mass likely represents a
large hydrocele.
IMPRESSION: 1.  No torsion is identified.
2.  There is again noted a large, septated cystic mass in the left scrotum,
likely representing a large, septated hydrocele.

## 2013-03-22 ENCOUNTER — Other Ambulatory Visit: Payer: Self-pay

## 2013-03-23 ENCOUNTER — Other Ambulatory Visit: Payer: Self-pay | Admitting: Cardiology

## 2013-03-30 ENCOUNTER — Other Ambulatory Visit: Payer: Self-pay | Admitting: Internal Medicine

## 2013-04-05 ENCOUNTER — Other Ambulatory Visit: Payer: Self-pay | Admitting: *Deleted

## 2013-04-06 ENCOUNTER — Other Ambulatory Visit: Payer: Self-pay | Admitting: *Deleted

## 2013-04-06 MED ORDER — ESCITALOPRAM OXALATE 20 MG PO TABS: ORAL_TABLET | ORAL | Status: DC

## 2013-04-06 MED ORDER — TAMSULOSIN HCL 0.4 MG PO CAPS: ORAL_CAPSULE | ORAL | Status: DC

## 2013-04-06 MED ORDER — DONEPEZIL HCL 5 MG PO TABS: ORAL_TABLET | ORAL | Status: DC

## 2013-04-06 NOTE — Telephone Encounter (Signed)
Patient has appointment scheduled for 04/27/13 May I refill since patient has an upcoming appointment? Last lab report 10/29/12

## 2013-04-06 NOTE — Telephone Encounter (Signed)
Okay to fill? 

## 2013-04-07 MED ORDER — DONEPEZIL HCL 5 MG PO TABS: 5.0000 mg | ORAL_TABLET | Freq: Every day | ORAL | Status: DC

## 2013-04-12 ENCOUNTER — Ambulatory Visit (INDEPENDENT_AMBULATORY_CARE_PROVIDER_SITE_OTHER): Payer: Self-pay

## 2013-04-12 DIAGNOSIS — I4891 Unspecified atrial fibrillation: Secondary | ICD-10-CM

## 2013-04-12 DIAGNOSIS — Z7901 Long term (current) use of anticoagulants: Secondary | ICD-10-CM

## 2013-04-16 ENCOUNTER — Other Ambulatory Visit: Payer: Self-pay | Admitting: Internal Medicine

## 2013-04-27 ENCOUNTER — Ambulatory Visit (INDEPENDENT_AMBULATORY_CARE_PROVIDER_SITE_OTHER): Payer: Medicare Other | Admitting: Internal Medicine

## 2013-04-27 ENCOUNTER — Encounter: Payer: Self-pay | Admitting: Internal Medicine

## 2013-04-27 VITALS — BP 170/72 | HR 66 | Temp 97.7°F | Resp 14 | Wt 175.0 lb

## 2013-04-27 DIAGNOSIS — E785 Hyperlipidemia, unspecified: Secondary | ICD-10-CM

## 2013-04-27 DIAGNOSIS — I10 Essential (primary) hypertension: Secondary | ICD-10-CM

## 2013-04-27 DIAGNOSIS — I6529 Occlusion and stenosis of unspecified carotid artery: Secondary | ICD-10-CM

## 2013-04-27 DIAGNOSIS — M25519 Pain in unspecified shoulder: Secondary | ICD-10-CM

## 2013-04-27 DIAGNOSIS — I4891 Unspecified atrial fibrillation: Secondary | ICD-10-CM

## 2013-04-27 DIAGNOSIS — R197 Diarrhea, unspecified: Secondary | ICD-10-CM

## 2013-04-27 DIAGNOSIS — M25512 Pain in left shoulder: Secondary | ICD-10-CM

## 2013-04-27 LAB — BASIC METABOLIC PANEL
CO2: 30 mEq/L (ref 19–32)
Calcium: 8.7 mg/dL (ref 8.4–10.5)
Glucose, Bld: 110 mg/dL — ABNORMAL HIGH (ref 70–99)
Sodium: 140 mEq/L (ref 135–145)

## 2013-04-27 MED ORDER — POLYETHYLENE GLYCOL 3350 17 GM/SCOOP PO POWD: 17.0000 g | Freq: Two times a day (BID) | ORAL | Status: AC | PRN

## 2013-04-27 NOTE — Patient Instructions (Addendum)
The diarrhea  May be caused by the aricept,  So stop it for a week.  And give the miralax,  It will help bulk up the stools   If the diarrhea does not resolve in a week,  Collect the stool for studies

## 2013-04-27 NOTE — Progress Notes (Signed)
Patient ID: Billy Kane, male   DOB: 06/06/1923, 79 y.o.   MRN: [id number]  Patient Active Problem List   Diagnosis Date Noted  . Diarrhea 04/29/2013  . Shoulder pain, left 04/27/2012  . Chronic back pain 10/26/2011  . Obesity (BMI 30-39.9) 10/26/2011  . Bronchitis 07/06/2011  . Multiple myeloma 06/14/2011  . Hypertension   . Hyperlipidemia   . Chronic kidney disease   . PUD (peptic ulcer disease)   . Long term current use of anticoagulant 11/07/2010  . CAROTID ARTERY STENOSIS, WITHOUT INFARCTION 10/01/2010  . ATRIAL FIBRILLATION 09/03/2010  . HYPERLIPIDEMIA-MIXED 04/15/2009  . CORONARY ATHEROSCLEROSIS OF ARTERY BYPASS GRAFT 04/15/2009  . CHEST PAIN UNSPECIFIED 04/15/2009  . CORONARY ARTERY BYPASS GRAFT, HX OF 04/15/2009    Subjective:  CC:   Chief Complaint  Patient presents with  . Follow-up    6 month    HPI:   Billy Kane a 79 y.o. male who presents  Past Medical History  Diagnosis Date  . Coronary artery disease   . Depression   . TIA (transient ischemic attack)   . CAD (coronary artery disease)     s/p AMI 1998  . Lymphocytic leukemia 12/10    small, diagnosed 12/10  . Multiple myeloma     diagnosed with monclonal spike fo 5 gm  . S/P coronary artery bypass graft x 4 1998    [facility], SVG to distal RCA, OM1 D1; LIMA to distal LAD  . Hypertension   . Hyperlipidemia   . Chronic kidney disease     Stage III, secondary to myeloma  . PUD (peptic ulcer disease)     Past Surgical History  Procedure Laterality Date  . Cholecystectomy    . Cholecystectomy  > 20 years ago  . Coronary artery bypass graft         The following portions of the patient's history were reviewed and updated as appropriate: Allergies, current medications, and problem list.    Review of Systems:   12 Pt  review of systems was negative except those addressed in the HPI,     History   Social History  . Marital Status: Widowed    Spouse Name: N/A    Number of Children:  N/A  . Years of Education: N/A   Occupational History  . Not on file.   Social History Main Topics  . Smoking status: Never Smoker   . Smokeless tobacco: Never Used  . Alcohol Use: No  . Drug Use: No  . Sexual Activity: No   Other Topics Concern  . Not on file   Social History Narrative   ** Merged History Encounter **        Objective:  Filed Vitals:   04/27/13 0911  BP: 170/72  Pulse: 66  Temp: 97.7 F (36.5 C)  Resp: 14     General appearance: alert, cooperative and appears stated age Ears: normal TM's and external ear canals both ears Throat: lips, mucosa, and tongue normal; teeth and gums normal Neck: no adenopathy, no carotid bruit, supple, symmetrical, trachea midline and thyroid not enlarged, symmetric, no tenderness/mass/nodules Back: symmetric, no curvature. ROM normal. No CVA tenderness. Lungs: clear to auscultation bilaterally Heart: regular rate and rhythm, S1, S2 normal, no murmur, click, rub or gallop Abdomen: soft, non-tender; bowel sounds normal; no masses,  no organomegaly Pulses: 2+ and symmetric Skin: Skin color, texture, turgor normal. No rashes or lesions Lymph nodes: Cervical, supraclavicular, and axillary nodes normal.  Assessment and  Plan:  Diarrhea He appears well albeit mildly dehydration suggested by orthostasis (relative).  Diarrhea unlikely to be infectious but suspecte due to Billy Kane.  Recommended suspending medicaiton and using daily miralax. Checking lytes including mg today   Hypertension No changes today given relative orthostasis  Shoulder pain, left DJD vs adhesive capuslitis.  Pain is controlled with fentanyl patch,    CAROTID ARTERY STENOSIS, WITHOUT INFARCTION Managed with statin therapy .  On coumadin for atrial fib.  Last doppler was 2012 at [facility]  HYPERLIPIDEMIA-MIXED LDL and triglycerides are at goal on current medications. He has no side effects and liver enzymes  Have been normal per outside labs . No changes  today   ATRIAL FIBRILLATION Rate controlled currently,  No evidence of diastolic dysfunction on exam.  Coumadin anticoagulation managed by cardiology   Updated Medication List Outpatient Encounter Prescriptions as of 04/27/2013  Medication Sig Dispense Refill  . acetaminophen (TYLENOL) 500 MG tablet Take 500 mg by mouth 2 (two) times daily.        Marland Kitchen aspirin 81 MG EC tablet Take 81 mg by mouth daily.        Marland Kitchen atorvastatin (LIPITOR) 20 MG tablet TAKE 1 TABLET BY MOUTH DAILY.  90 tablet  1  . budesonide-formoterol (SYMBICORT) 160-4.5 MCG/ACT inhaler Inhale 1 puff into the lungs 2 (two) times daily as needed.        . Cholecalciferol (VITAMIN D-3) 1000 UNITS CAPS Take 1 capsule by mouth daily.      . diphenhydrAMINE (BENADRYL) 25 MG tablet Take 25 mg by mouth at bedtime.      [name] Sodium (STOOL SOFTENER) 100 MG capsule Take 100 mg by mouth 2 (two) times daily as needed.        . enalapril (VASOTEC) 2.5 MG tablet Take 2.5 mg by mouth daily.      Marland Kitchen escitalopram (LEXAPRO) 20 MG tablet TAKE 1 TABLET BY MOUTH DAILY.  30 tablet  2  . fentaNYL (DURAGESIC - DOSED MCG/HR) 12 MCG/HR Place 1 patch onto the skin every other day.       . finasteride (PROSCAR) 5 MG tablet Take 1 tablet (5 mg total) by mouth daily.  90 tablet  3  . guaiFENesin (MUCINEX) 600 MG 12 hr tablet Take 1,200 mg by mouth 2 (two) times daily.      . Lenalidomide (REVLIMID) 5 MG CAPS Take 1 capsule by mouth as directed. 1 capsule for 21 days then 7 days off then repeat       . methocarbamol (ROBAXIN) 500 MG tablet Take 1/2 to 1 tablet by mouth every 12 hours, as needed       . metoprolol tartrate (LOPRESSOR) 25 MG tablet TAKE 1/2 TABLET BY MOUTH 2 TIMES DAILY  90 tablet  1  . Multiple Vitamin (MULTIVITAMIN) tablet Take 1 tablet by mouth daily.      . tamsulosin (FLOMAX) 0.4 MG CAPS capsule TAKE 1 CAPSULE EVERY DAY  30 capsule  5  . TDaP (BOOSTRIX) 5-2.5-18.5 LF-MCG/0.5 injection Inject 0.5 mLs into the muscle once.  0.5 mL  0  .  warfarin (COUMADIN) 5 MG tablet TAKE 1 TABLET AS DIRECTED  45 tablet  3  . [DISCONTINUED] donepezil (ARICEPT) 5 MG tablet Take 1 tablet (5 mg total) by mouth daily.  90 tablet  5  . [DISCONTINUED] donepezil (ARICEPT) 5 MG tablet TAKE 1 TABLET BY MOUTH DAILY.  30 tablet  5  . polyethylene glycol powder (GLYCOLAX/MIRALAX) powder Take 17 g by  mouth 2 (two) times daily as needed.  3350 g  1   No facility-administered encounter medications on file as of 04/27/2013.

## 2013-04-28 ENCOUNTER — Encounter: Payer: Self-pay | Admitting: *Deleted

## 2013-04-29 ENCOUNTER — Encounter: Payer: Self-pay | Admitting: Internal Medicine

## 2013-04-29 DIAGNOSIS — R197 Diarrhea, unspecified: Secondary | ICD-10-CM | POA: Insufficient documentation

## 2013-04-29 NOTE — Assessment & Plan Note (Addendum)
Managed with statin therapy .  On coumadin for atrial fib.  Last doppler was 2012 at [facility]

## 2013-04-29 NOTE — Assessment & Plan Note (Addendum)
DJD vs adhesive capuslitis.  Pain is controlled with fentanyl patch,

## 2013-04-29 NOTE — Assessment & Plan Note (Addendum)
He appears well albeit mildly dehydration suggested by orthostasis (relative).  Diarrhea unlikely to be infectious but suspecte due to [name].  Recommended suspending medicaiton and using daily miralax. Checking lytes including mg today

## 2013-04-29 NOTE — Assessment & Plan Note (Addendum)
LDL and triglycerides are at goal on current medications. He has no side effects and liver enzymes  Have been normal per outside labs . No changes today

## 2013-04-29 NOTE — Assessment & Plan Note (Signed)
Rate controlled currently,  No evidence of diastolic dysfunction on exam.  Coumadin anticoagulation managed by cardiology

## 2013-04-29 NOTE — Assessment & Plan Note (Signed)
No changes today given relative orthostasis

## 2013-05-01 ENCOUNTER — Ambulatory Visit: Payer: Self-pay | Admitting: Oncology

## 2013-05-02 LAB — CBC CANCER CENTER
Basophil #: 0 x10 3/mm (ref 0.0–0.1)
Eosinophil %: 10.7 %
HCT: 38.6 % — ABNORMAL LOW (ref 40.0–52.0)
Lymphocyte #: 1.9 x10 3/mm (ref 1.0–3.6)
Lymphocyte %: 28.9 %
MCH: 28.2 pg (ref 26.0–34.0)
MCHC: 32.6 g/dL (ref 32.0–36.0)
MCV: 87 fL (ref 80–100)
Monocyte #: 1.1 x10 3/mm — ABNORMAL HIGH (ref 0.2–1.0)
Monocyte %: 17.3 %
Neutrophil #: 2.8 x10 3/mm (ref 1.4–6.5)
Neutrophil %: 42.6 %
Platelet: 143 x10 3/mm — ABNORMAL LOW (ref 150–440)
RBC: 4.45 10*6/uL (ref 4.40–5.90)

## 2013-05-02 LAB — COMPREHENSIVE METABOLIC PANEL
Albumin: 3.4 g/dL (ref 3.4–5.0)
Anion Gap: 7 (ref 7–16)
Bilirubin,Total: 0.5 mg/dL (ref 0.2–1.0)
Calcium, Total: 8.7 mg/dL (ref 8.5–10.1)
Chloride: 104 mmol/L (ref 98–107)
EGFR (Non-African Amer.): 42 — ABNORMAL LOW
Glucose: 101 mg/dL — ABNORMAL HIGH (ref 65–99)
Osmolality: 284 (ref 275–301)
Potassium: 4.3 mmol/L (ref 3.5–5.1)
Total Protein: 6.1 g/dL — ABNORMAL LOW (ref 6.4–8.2)

## 2013-05-03 ENCOUNTER — Telehealth: Payer: Self-pay | Admitting: *Deleted

## 2013-05-03 LAB — PROT IMMUNOELECTROPHORES(ARMC)

## 2013-05-03 NOTE — Telephone Encounter (Signed)
No, he is due for follow up bc his last carotid doppler was 2012 and they are usually done annually. He has some mild stenosis which we are monitoring

## 2013-05-03 NOTE — Telephone Encounter (Signed)
Patient called stating [facility] called wanting patient to set up for doppler ultrasound on carotid arteries, patient does not recall discussing this at visit and was just concerned if something was wrong or as to why test is to be done, saw referral but nothing in notes except last doppler 2012, please advise.

## 2013-05-08 ENCOUNTER — Telehealth: Payer: Self-pay | Admitting: Internal Medicine

## 2013-05-08 NOTE — Assessment & Plan Note (Signed)
1-49% bilateral ICA by [facility] Sept 2014

## 2013-05-08 NOTE — Telephone Encounter (Signed)
Carotid artery stenosis is stable and non critical .  No intervention needed

## 2013-05-09 ENCOUNTER — Encounter: Payer: Self-pay | Admitting: Cardiology

## 2013-05-09 NOTE — Telephone Encounter (Signed)
Voicemail left.

## 2013-05-09 NOTE — Telephone Encounter (Signed)
Voicemail left for patient to return call to office.

## 2013-05-10 ENCOUNTER — Ambulatory Visit (INDEPENDENT_AMBULATORY_CARE_PROVIDER_SITE_OTHER): Payer: Medicare Other

## 2013-05-10 ENCOUNTER — Encounter: Payer: Self-pay | Admitting: Internal Medicine

## 2013-05-10 DIAGNOSIS — Z7901 Long term (current) use of anticoagulants: Secondary | ICD-10-CM

## 2013-05-10 DIAGNOSIS — I4891 Unspecified atrial fibrillation: Secondary | ICD-10-CM

## 2013-05-10 LAB — POCT INR: INR: 2

## 2013-05-11 ENCOUNTER — Telehealth: Payer: Self-pay | Admitting: *Deleted

## 2013-05-12 ENCOUNTER — Encounter: Payer: Self-pay | Admitting: *Deleted

## 2013-05-12 NOTE — Telephone Encounter (Signed)
Opened in error

## 2013-05-12 NOTE — Telephone Encounter (Signed)
Letter mailed

## 2013-05-17 ENCOUNTER — Ambulatory Visit: Payer: Self-pay | Admitting: Oncology

## 2013-05-19 ENCOUNTER — Encounter: Payer: Self-pay | Admitting: Internal Medicine

## 2013-06-07 ENCOUNTER — Ambulatory Visit (INDEPENDENT_AMBULATORY_CARE_PROVIDER_SITE_OTHER): Payer: Medicare Other | Admitting: General Practice

## 2013-06-07 DIAGNOSIS — I4891 Unspecified atrial fibrillation: Secondary | ICD-10-CM

## 2013-06-07 DIAGNOSIS — Z7901 Long term (current) use of anticoagulants: Secondary | ICD-10-CM

## 2013-06-07 LAB — POCT INR: INR: 2.3

## 2013-06-22 ENCOUNTER — Other Ambulatory Visit: Payer: Self-pay

## 2013-06-27 ENCOUNTER — Ambulatory Visit: Payer: Self-pay | Admitting: Oncology

## 2013-06-27 LAB — CBC CANCER CENTER
Basophil #: 0.1 x10 3/mm (ref 0.0–0.1)
Eosinophil #: 0.7 x10 3/mm (ref 0.0–0.7)
HGB: 12.1 g/dL — ABNORMAL LOW (ref 13.0–18.0)
Lymphocyte #: 1.9 x10 3/mm (ref 1.0–3.6)
Lymphocyte %: 28.8 %
MCH: 27.8 pg (ref 26.0–34.0)
MCHC: 32.5 g/dL (ref 32.0–36.0)
Monocyte %: 17.4 %
Neutrophil #: 2.7 x10 3/mm (ref 1.4–6.5)
Platelet: 173 x10 3/mm (ref 150–440)
RBC: 4.37 10*6/uL — ABNORMAL LOW (ref 4.40–5.90)
RDW: 15.3 % — ABNORMAL HIGH (ref 11.5–14.5)
WBC: 6.5 x10 3/mm (ref 3.8–10.6)

## 2013-06-27 LAB — COMPREHENSIVE METABOLIC PANEL
Anion Gap: 5 — ABNORMAL LOW (ref 7–16)
BUN: 16 mg/dL (ref 7–18)
Bilirubin,Total: 0.6 mg/dL (ref 0.2–1.0)
Calcium, Total: 8.5 mg/dL (ref 8.5–10.1)
Co2: 31 mmol/L (ref 21–32)
Creatinine: 1.5 mg/dL — ABNORMAL HIGH (ref 0.60–1.30)
EGFR (African American): 47 — ABNORMAL LOW
Potassium: 4.1 mmol/L (ref 3.5–5.1)
SGOT(AST): 21 U/L (ref 15–37)
SGPT (ALT): 22 U/L (ref 12–78)
Sodium: 141 mmol/L (ref 136–145)

## 2013-06-29 LAB — PROT IMMUNOELECTROPHORES(ARMC)

## 2013-07-05 ENCOUNTER — Ambulatory Visit (INDEPENDENT_AMBULATORY_CARE_PROVIDER_SITE_OTHER): Payer: Medicare Other | Admitting: General Practice

## 2013-07-05 ENCOUNTER — Telehealth: Payer: Self-pay | Admitting: Internal Medicine

## 2013-07-05 DIAGNOSIS — Z7901 Long term (current) use of anticoagulants: Secondary | ICD-10-CM

## 2013-07-05 DIAGNOSIS — F0390 Unspecified dementia without behavioral disturbance: Secondary | ICD-10-CM

## 2013-07-05 DIAGNOSIS — I4891 Unspecified atrial fibrillation: Secondary | ICD-10-CM

## 2013-07-05 LAB — POCT INR: INR: 2.5

## 2013-07-05 NOTE — Telephone Encounter (Signed)
Christ Kick (caregiver) calling to report pt daughter wants to see if pt can be started on Namenda.  Has taken him off Aracept, which was causing diarrhea and states that has really helped.  They are thinking Namenda may help him.  Says he makes up stuff, like that he mowed the yard today.  It is more noticable and short-term memory is worsening.  Daughter is out of town.  Asking to call [phone], caregiver.

## 2013-07-06 MED ORDER — MEMANTINE HCL 28 X 5 MG & 21 X 10 MG PO TABS: ORAL_TABLET | ORAL | Status: DC

## 2013-07-06 NOTE — Telephone Encounter (Signed)
Namenda titration pack for first month sent to [facility],  The dose is titrated weekly until he reaches 10 mg twice daily.  Make appt in one month for follow up

## 2013-07-06 NOTE — Telephone Encounter (Signed)
Left message for patient to return call to office. 

## 2013-07-12 ENCOUNTER — Other Ambulatory Visit: Payer: Self-pay | Admitting: Internal Medicine

## 2013-07-12 NOTE — Telephone Encounter (Signed)
Caregiver stated that patient erased messages and called caregiver back and appointment will be setup on Friday.

## 2013-07-17 ENCOUNTER — Ambulatory Visit: Payer: Self-pay | Admitting: Oncology

## 2013-07-22 IMAGING — CR DG SHOULDER 3+V*R*
1 series · 3 of 3 positions shown · non-contrast
Comparison: none

REASON FOR EXAM: pain
COMMENTS:   LMP: (Male)

PROCEDURE:     DXR - DXR SHOULDER RIGHT COMPLETE  - July 18, 2011  [DATE]
RESULT:     Comparison: None.

[Series 3: w shoulder external right · 0.14mm/px · 3 of 3 slices shown]
[im 1/3]
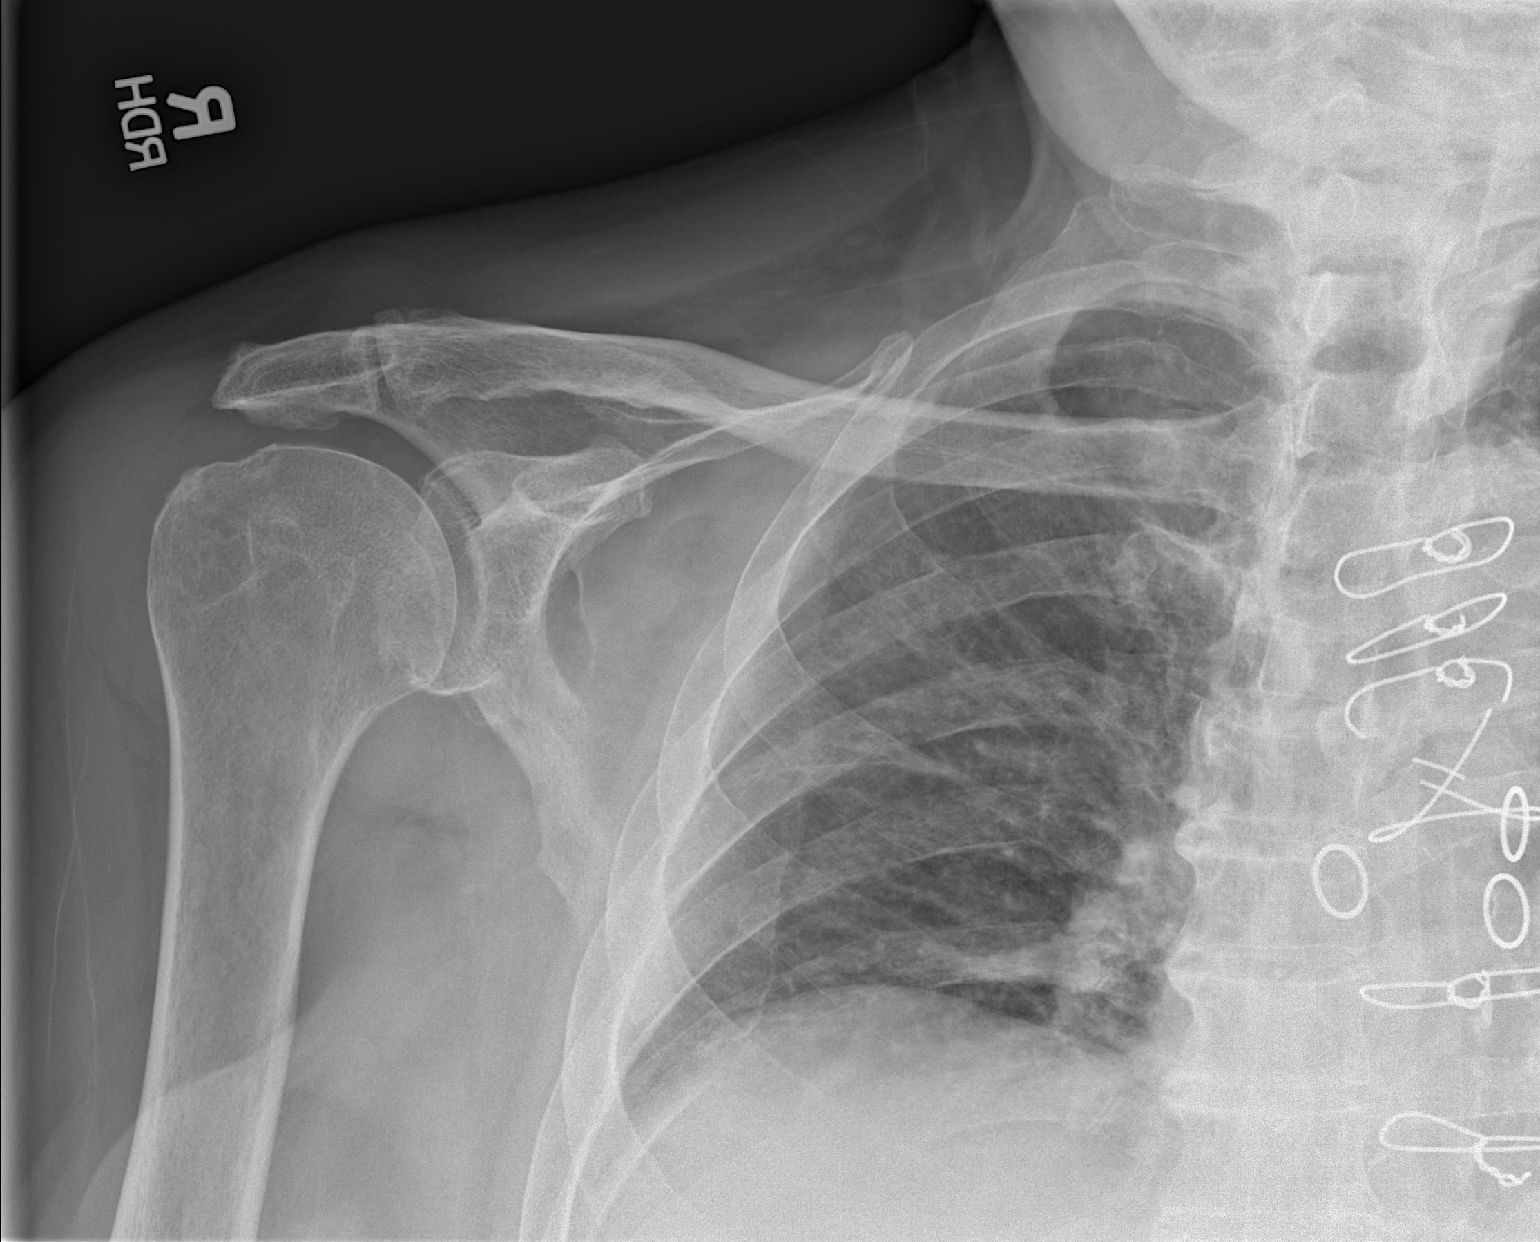
[im 2/3]
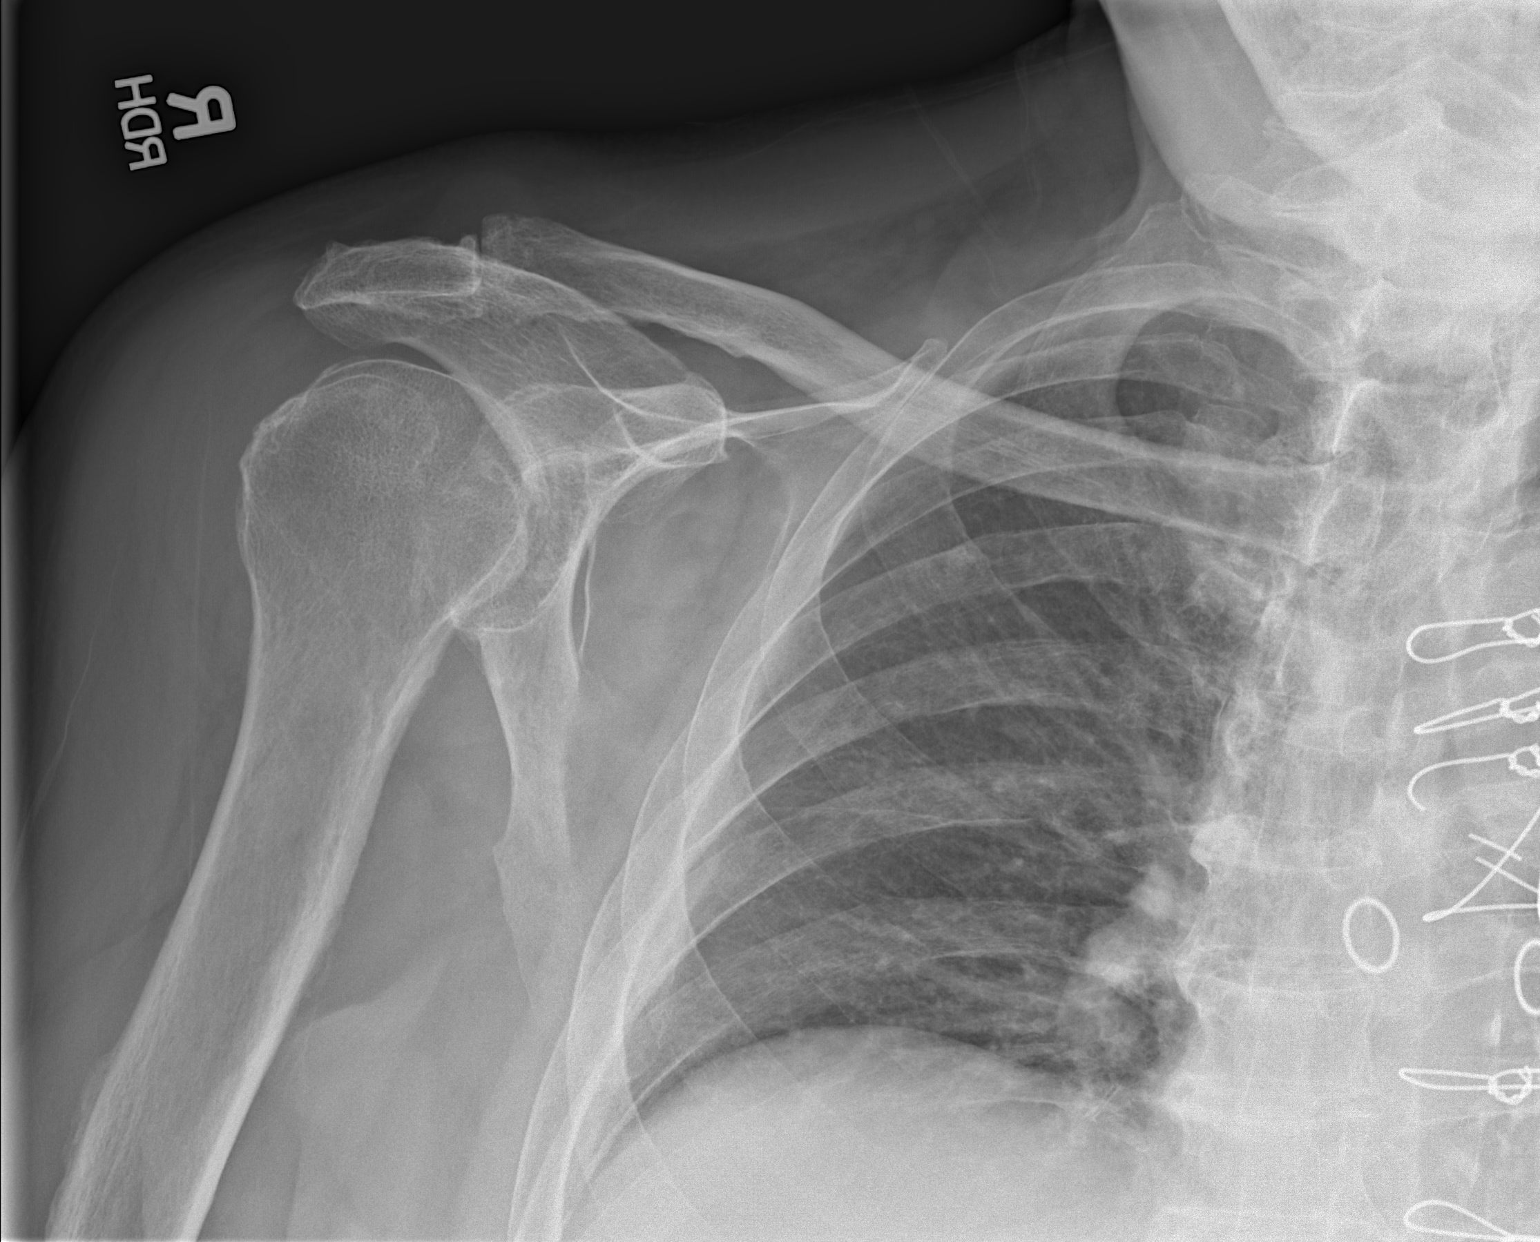
[im 3/3]
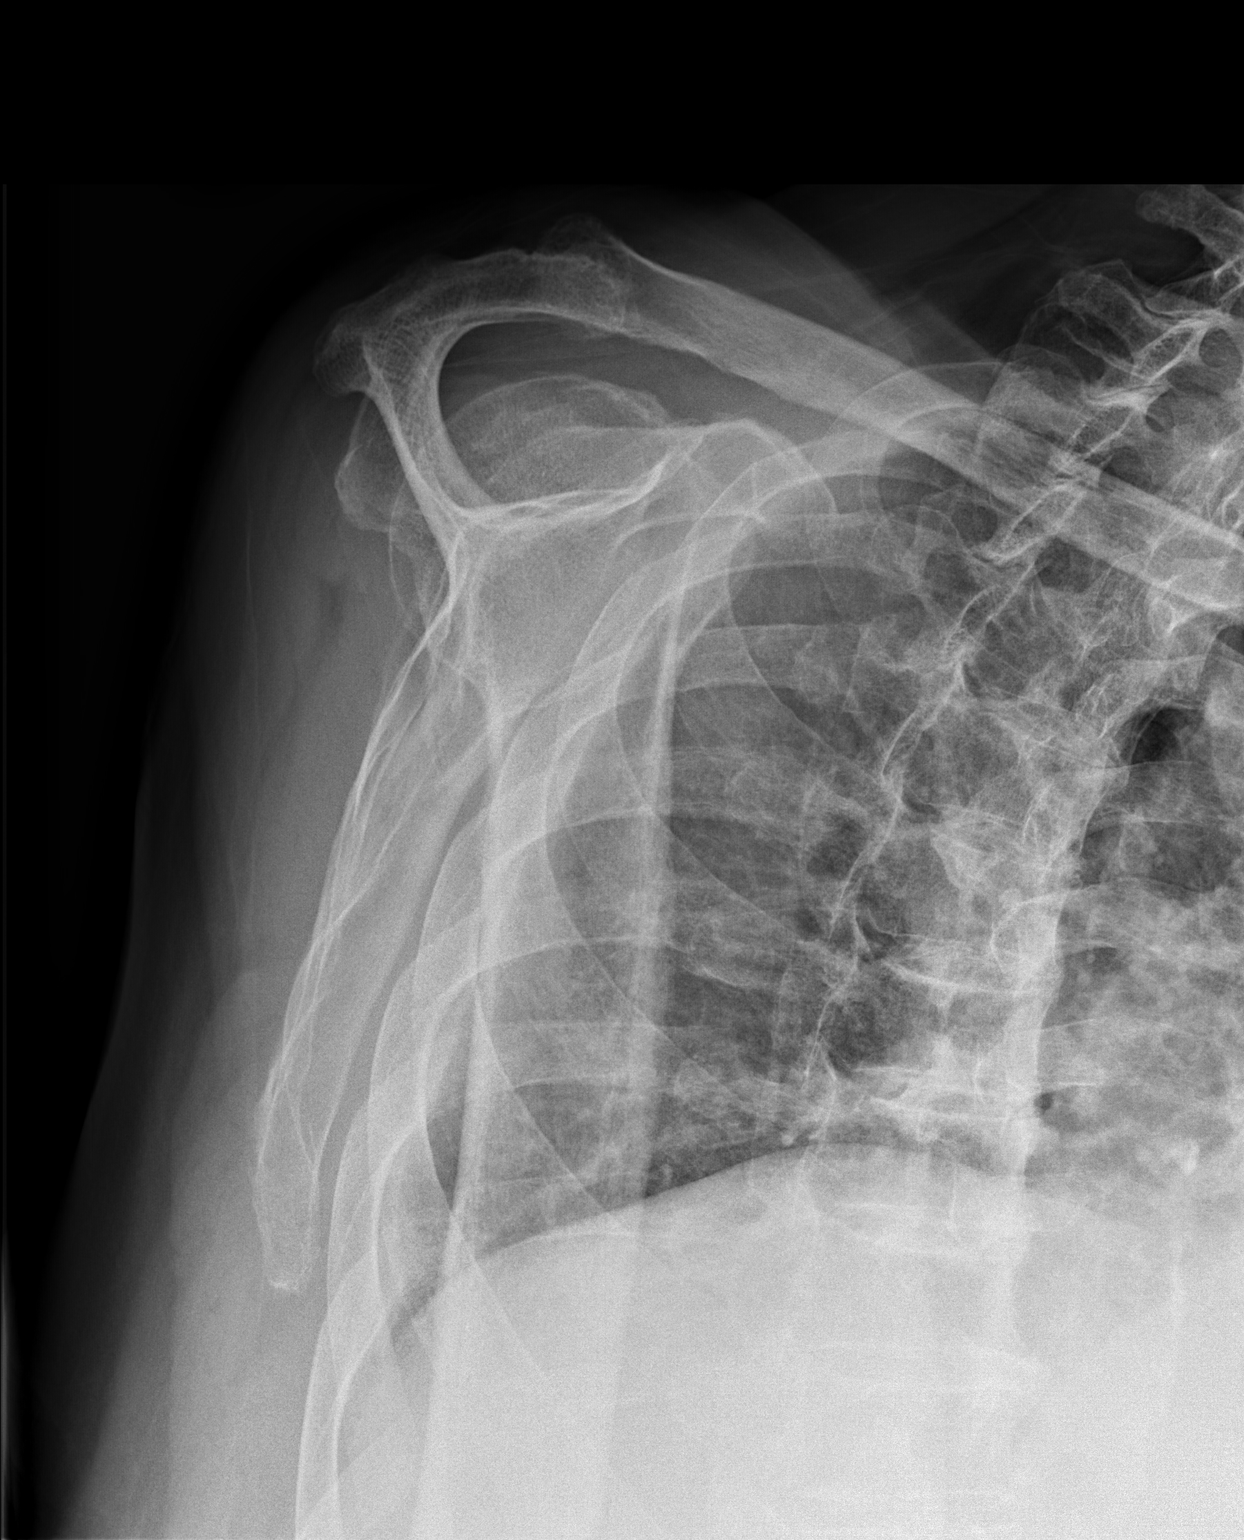

[3 of 3 positions shown; findings below may reference images not displayed]

FINDINGS: No acute fracture or dislocation. There is mild degenerative change of the
acromioclavicular joint.
IMPRESSION: Mild degenerative change of the acromioclavicular joint.

## 2013-08-02 ENCOUNTER — Ambulatory Visit (INDEPENDENT_AMBULATORY_CARE_PROVIDER_SITE_OTHER): Payer: Medicare Other

## 2013-08-02 DIAGNOSIS — I4891 Unspecified atrial fibrillation: Secondary | ICD-10-CM

## 2013-08-02 DIAGNOSIS — Z7901 Long term (current) use of anticoagulants: Secondary | ICD-10-CM

## 2013-08-08 LAB — COMPREHENSIVE METABOLIC PANEL
Albumin: 3.5 g/dL (ref 3.4–5.0)
Alkaline Phosphatase: 72 U/L
Anion Gap: 5 — ABNORMAL LOW (ref 7–16)
Bilirubin,Total: 0.4 mg/dL (ref 0.2–1.0)
Calcium, Total: 8.3 mg/dL — ABNORMAL LOW (ref 8.5–10.1)
EGFR (African American): 46 — ABNORMAL LOW
EGFR (Non-African Amer.): 40 — ABNORMAL LOW
Glucose: 145 mg/dL — ABNORMAL HIGH (ref 65–99)
Osmolality: 287 (ref 275–301)
Potassium: 3.6 mmol/L (ref 3.5–5.1)
SGOT(AST): 27 U/L (ref 15–37)
SGPT (ALT): 34 U/L (ref 12–78)
Sodium: 141 mmol/L (ref 136–145)

## 2013-08-08 LAB — CBC CANCER CENTER
Eosinophil #: 0.4 x10 3/mm (ref 0.0–0.7)
Eosinophil %: 6.5 %
HCT: 40 % (ref 40.0–52.0)
HGB: 12.8 g/dL — ABNORMAL LOW (ref 13.0–18.0)
Lymphocyte #: 1.8 x10 3/mm (ref 1.0–3.6)
Lymphocyte %: 30.2 %
MCHC: 32.1 g/dL (ref 32.0–36.0)
Monocyte %: 8.7 %
Neutrophil #: 3.2 x10 3/mm (ref 1.4–6.5)
Neutrophil %: 53.7 %
RBC: 4.62 10*6/uL (ref 4.40–5.90)
WBC: 5.9 x10 3/mm (ref 3.8–10.6)

## 2013-08-09 LAB — PROT IMMUNOELECTROPHORES(ARMC)

## 2013-08-09 LAB — KAPPA/LAMBDA FREE LIGHT CHAINS (ARMC)

## 2013-08-15 ENCOUNTER — Encounter: Payer: Self-pay | Admitting: Internal Medicine

## 2013-08-15 ENCOUNTER — Ambulatory Visit (INDEPENDENT_AMBULATORY_CARE_PROVIDER_SITE_OTHER): Payer: Medicare Other | Admitting: Internal Medicine

## 2013-08-15 VITALS — BP 126/60 | HR 53 | Temp 97.8°F | Resp 12 | Wt 193.0 lb

## 2013-08-15 DIAGNOSIS — E669 Obesity, unspecified: Secondary | ICD-10-CM

## 2013-08-15 DIAGNOSIS — E785 Hyperlipidemia, unspecified: Secondary | ICD-10-CM

## 2013-08-15 DIAGNOSIS — F068 Other specified mental disorders due to known physiological condition: Secondary | ICD-10-CM

## 2013-08-15 DIAGNOSIS — R635 Abnormal weight gain: Secondary | ICD-10-CM

## 2013-08-15 DIAGNOSIS — N401 Enlarged prostate with lower urinary tract symptoms: Secondary | ICD-10-CM

## 2013-08-15 DIAGNOSIS — F0390 Unspecified dementia without behavioral disturbance: Secondary | ICD-10-CM

## 2013-08-15 LAB — COMPREHENSIVE METABOLIC PANEL
Albumin: 3.7 g/dL (ref 3.5–5.2)
Alkaline Phosphatase: 56 U/L (ref 39–117)
BUN: 17 mg/dL (ref 6–23)
CO2: 29 mEq/L (ref 19–32)
Calcium: 8.6 mg/dL (ref 8.4–10.5)
GFR: 46.84 mL/min — ABNORMAL LOW (ref >60.00)
Glucose, Bld: 95 mg/dL (ref 70–99)
Potassium: 4.6 mEq/L (ref 3.5–5.1)
Sodium: 139 mEq/L (ref 135–145)
Total Protein: 5.7 g/dL — ABNORMAL LOW (ref 6.0–8.3)

## 2013-08-15 MED ORDER — MEMANTINE HCL 10 MG PO TABS: 10.0000 mg | ORAL_TABLET | Freq: Two times a day (BID) | ORAL | Status: AC

## 2013-08-15 MED ORDER — FINASTERIDE 5 MG PO TABS: 5.0000 mg | ORAL_TABLET | Freq: Every day | ORAL | Status: AC

## 2013-08-15 NOTE — Patient Instructions (Signed)
You have gained a few pounds!!!  Please cut back on the Christmas cookies and eat more yogurts and fruits for dessert  We will check you for diabetes today  Continue the namenda twice daily since it is helping your memory  No dose adjustment is needed   Return in 3 months

## 2013-08-15 NOTE — Progress Notes (Signed)
Patient ID: Billy Kane, male   DOB: 01/22/1923, 79 y.o.   MRN: [id number]   Patient Active Problem List   Diagnosis Date Noted  . Dementia 08/16/2013  . Weight gain 08/16/2013  . Diarrhea 04/29/2013  . Shoulder pain, left 04/27/2012  . Chronic back pain 10/26/2011  . Obesity (BMI 30-39.9) 10/26/2011  . Multiple myeloma 06/14/2011  . Hypertension   . Chronic kidney disease   . PUD (peptic ulcer disease)   . Long term current use of anticoagulant 11/07/2010  . CAROTID ARTERY STENOSIS, WITHOUT INFARCTION 10/01/2010  . ATRIAL FIBRILLATION 09/03/2010  . HYPERLIPIDEMIA-MIXED 04/15/2009  . CORONARY ATHEROSCLEROSIS OF ARTERY BYPASS GRAFT 04/15/2009  . CHEST PAIN UNSPECIFIED 04/15/2009  . CORONARY ARTERY BYPASS GRAFT, HX OF 04/15/2009    Subjective:  CC:   Chief Complaint  Patient presents with  . Follow-up    1 month    HPI:   Billy Kane a 79 y.o. male who presents Follow up on recent initiation of Namenda for dementia.  He is tolerating the medication without side effects.  His caregiver has noticed an improvement in his awareness of details relating to orientation (date, day, etc)     Past Medical History  Diagnosis Date  . Coronary artery disease   . Depression   . TIA (transient ischemic attack)   . CAD (coronary artery disease)     s/p AMI 1998  . Lymphocytic leukemia 12/10    small, diagnosed 12/10  . Multiple myeloma     diagnosed with monclonal spike fo 5 gm  . S/P coronary artery bypass graft x 4 1998    [facility], SVG to distal RCA, OM1 D1; LIMA to distal LAD  . Hypertension   . Hyperlipidemia   . Chronic kidney disease     Stage III, secondary to myeloma  . PUD (peptic ulcer disease)     Past Surgical History  Procedure Laterality Date  . Cholecystectomy    . Cholecystectomy  > 20 years ago  . Coronary artery bypass graft         The following portions of the patient's history were reviewed and updated as appropriate: Allergies, current  medications, and problem list.    Review of Systems:   12 Pt  review of systems was negative except those addressed in the HPI,     History   Social History  . Marital Status: Widowed    Spouse Name: N/A    Number of Children: N/A  . Years of Education: N/A   Occupational History  . Not on file.   Social History Main Topics  . Smoking status: Never Smoker   . Smokeless tobacco: Never Used  . Alcohol Use: No  . Drug Use: No  . Sexual Activity: No   Other Topics Concern  . Not on file   Social History Narrative   ** Merged History Encounter **        Objective:  Filed Vitals:   08/15/13 1138  BP: 126/60  Pulse: 53  Temp: 97.8 F (36.6 C)  Resp: 12     General appearance: alert, cooperative and appears stated age Ears: normal TM's and external ear canals both ears Throat: lips, mucosa, and tongue normal; teeth and gums normal Neck: no adenopathy, no carotid bruit, supple, symmetrical, trachea midline and thyroid not enlarged, symmetric, no tenderness/mass/nodules Back: symmetric, no curvature. ROM normal. No CVA tenderness. Lungs: clear to auscultation bilaterally Heart: regular rate and rhythm, S1, S2 normal, no  murmur, click, rub or gallop Abdomen: soft, non-tender; bowel sounds normal; no masses,  no organomegaly Pulses: 2+ and symmetric Skin: Skin color, texture, turgor normal. No rashes or lesions Lymph nodes: Cervical, supraclavicular, and axillary nodes normal.  Assessment and Plan:  Dementia Improved slightly with Namenda trial ,  Continue 10 mg bid.   Weight gain I have addressed  BMI and recommended a low glycemic index diet utilizing smaller more frequent meals to increase metabolism. Screening for diabetes to be done.    Updated Medication List Outpatient Encounter Prescriptions as of 08/15/2013  Medication Sig  . acetaminophen (TYLENOL) 500 MG tablet Take 500 mg by mouth 2 (two) times daily.    Marland Kitchen atorvastatin (LIPITOR) 20 MG tablet  TAKE 1 TABLET BY MOUTH DAILY.  Marland Kitchen Cholecalciferol (VITAMIN D-3) 1000 UNITS CAPS Take 1 capsule by mouth daily.  . diphenhydrAMINE (BENADRYL) 25 MG tablet Take 25 mg by mouth at bedtime.  [name] Sodium (STOOL SOFTENER) 100 MG capsule Take 100 mg by mouth 2 (two) times daily as needed.    . enalapril (VASOTEC) 2.5 MG tablet Take 2.5 mg by mouth daily.  Marland Kitchen escitalopram (LEXAPRO) 20 MG tablet TAKE 1 TABLET BY MOUTH DAILY.  . fentaNYL (DURAGESIC - DOSED MCG/HR) 12 MCG/HR Place 1 patch onto the skin every other day.   . finasteride (PROSCAR) 5 MG tablet Take 1 tablet (5 mg total) by mouth daily.  Marland Kitchen guaiFENesin (MUCINEX) 600 MG 12 hr tablet Take 1,200 mg by mouth 2 (two) times daily.  . Lenalidomide (REVLIMID) 5 MG CAPS Take 1 capsule by mouth as directed. 1 capsule for 21 days then 7 days off then repeat   . memantine (NAMENDA TITRATION PAK) tablet pack 5 mg/day for =1 week; 5 mg twice daily for =1 week; 15 mg/day given in 5 mg and 10 mg separated doses for =1 week; then 10 mg twice daily  . methocarbamol (ROBAXIN) 500 MG tablet Take 1/2 to 1 tablet by mouth every 12 hours, as needed   . metoprolol tartrate (LOPRESSOR) 25 MG tablet TAKE 1/2 TABLET BY MOUTH 2 TIMES DAILY  . Multiple Vitamin (MULTIVITAMIN) tablet Take 1 tablet by mouth daily.  . polyethylene glycol powder (GLYCOLAX/MIRALAX) powder Take 17 g by mouth 2 (two) times daily as needed.  . tamsulosin (FLOMAX) 0.4 MG CAPS capsule TAKE 1 CAPSULE EVERY DAY  . TDaP (BOOSTRIX) 5-2.5-18.5 LF-MCG/0.5 injection Inject 0.5 mLs into the muscle once.  . warfarin (COUMADIN) 5 MG tablet TAKE 1 TABLET AS DIRECTED  . [DISCONTINUED] finasteride (PROSCAR) 5 MG tablet Take 1 tablet (5 mg total) by mouth daily.  Marland Kitchen aspirin 81 MG EC tablet Take 81 mg by mouth daily.    . memantine (NAMENDA) 10 MG tablet Take 1 tablet (10 mg total) by mouth 2 (two) times daily.  . [DISCONTINUED] budesonide-formoterol (SYMBICORT) 160-4.5 MCG/ACT inhaler Inhale 1 puff into the  lungs 2 (two) times daily as needed.       Orders Placed This Encounter  Procedures  . Hemoglobin A1c  . Comprehensive metabolic panel    No Follow-up on file.

## 2013-08-15 NOTE — Progress Notes (Signed)
Pre-visit discussion using our clinic review tool. No additional management support is needed unless otherwise documented below in the visit note.  

## 2013-08-16 ENCOUNTER — Encounter: Payer: Self-pay | Admitting: Internal Medicine

## 2013-08-16 DIAGNOSIS — R635 Abnormal weight gain: Secondary | ICD-10-CM | POA: Insufficient documentation

## 2013-08-16 DIAGNOSIS — F0150 Vascular dementia without behavioral disturbance: Secondary | ICD-10-CM | POA: Insufficient documentation

## 2013-08-16 NOTE — Assessment & Plan Note (Signed)
Improved slightly with Namenda trial ,  Continue 10 mg bid.

## 2013-08-16 NOTE — Assessment & Plan Note (Signed)
I have addressed  BMI and recommended a low glycemic index diet utilizing smaller more frequent meals to increase metabolism. Screening for diabetes to be done.

## 2013-08-17 ENCOUNTER — Ambulatory Visit: Payer: Self-pay | Admitting: Oncology

## 2013-08-22 ENCOUNTER — Other Ambulatory Visit: Payer: Self-pay | Admitting: Cardiology

## 2013-08-30 ENCOUNTER — Ambulatory Visit (INDEPENDENT_AMBULATORY_CARE_PROVIDER_SITE_OTHER): Payer: Medicare Other

## 2013-08-30 DIAGNOSIS — Z7901 Long term (current) use of anticoagulants: Secondary | ICD-10-CM

## 2013-08-30 DIAGNOSIS — I4891 Unspecified atrial fibrillation: Secondary | ICD-10-CM

## 2013-08-30 LAB — POCT INR: INR: 2.2

## 2013-09-05 LAB — CBC CANCER CENTER
BASOS ABS: 0.1 x10 3/mm (ref 0.0–0.1)
BASOS PCT: 1.1 %
Eosinophil #: 0.3 x10 3/mm (ref 0.0–0.7)
Eosinophil %: 5.6 %
HCT: 38.8 % — AB (ref 40.0–52.0)
HGB: 12.4 g/dL — ABNORMAL LOW (ref 13.0–18.0)
LYMPHS PCT: 32.9 %
Lymphocyte #: 1.9 x10 3/mm (ref 1.0–3.6)
MCH: 27.4 pg (ref 26.0–34.0)
MCHC: 32 g/dL (ref 32.0–36.0)
MCV: 86 fL (ref 80–100)
MONOS PCT: 11.7 %
Monocyte #: 0.7 x10 3/mm (ref 0.2–1.0)
NEUTROS PCT: 48.7 %
Neutrophil #: 2.9 x10 3/mm (ref 1.4–6.5)
PLATELETS: 140 x10 3/mm — AB (ref 150–440)
RBC: 4.53 10*6/uL (ref 4.40–5.90)
RDW: 16.2 % — ABNORMAL HIGH (ref 11.5–14.5)
WBC: 5.9 x10 3/mm (ref 3.8–10.6)

## 2013-09-05 LAB — COMPREHENSIVE METABOLIC PANEL
ALT: 23 U/L (ref 12–78)
Albumin: 3.4 g/dL (ref 3.4–5.0)
Alkaline Phosphatase: 66 U/L
Anion Gap: 7 (ref 7–16)
BUN: 16 mg/dL (ref 7–18)
Bilirubin,Total: 0.4 mg/dL (ref 0.2–1.0)
CHLORIDE: 105 mmol/L (ref 98–107)
Calcium, Total: 7.9 mg/dL — ABNORMAL LOW (ref 8.5–10.1)
Co2: 29 mmol/L (ref 21–32)
Creatinine: 1.5 mg/dL — ABNORMAL HIGH (ref 0.60–1.30)
EGFR (African American): 47 — ABNORMAL LOW
EGFR (Non-African Amer.): 41 — ABNORMAL LOW
Glucose: 100 mg/dL — ABNORMAL HIGH (ref 65–99)
Osmolality: 283 (ref 275–301)
Potassium: 3.9 mmol/L (ref 3.5–5.1)
SGOT(AST): 21 U/L (ref 15–37)
Sodium: 141 mmol/L (ref 136–145)
Total Protein: 6.4 g/dL (ref 6.4–8.2)

## 2013-09-07 LAB — PROT IMMUNOELECTROPHORES(ARMC)

## 2013-09-07 LAB — KAPPA/LAMBDA FREE LIGHT CHAINS (ARMC)

## 2013-09-17 ENCOUNTER — Ambulatory Visit: Payer: Self-pay | Admitting: Oncology

## 2013-09-25 ENCOUNTER — Other Ambulatory Visit: Payer: Self-pay | Admitting: Cardiology

## 2013-09-25 ENCOUNTER — Other Ambulatory Visit: Payer: Self-pay | Admitting: Internal Medicine

## 2013-09-27 ENCOUNTER — Ambulatory Visit (INDEPENDENT_AMBULATORY_CARE_PROVIDER_SITE_OTHER): Payer: Medicare Other | Admitting: Pharmacist

## 2013-09-27 DIAGNOSIS — I4891 Unspecified atrial fibrillation: Secondary | ICD-10-CM

## 2013-09-27 DIAGNOSIS — Z7901 Long term (current) use of anticoagulants: Secondary | ICD-10-CM

## 2013-09-27 DIAGNOSIS — Z5181 Encounter for therapeutic drug level monitoring: Secondary | ICD-10-CM

## 2013-09-27 LAB — POCT INR: INR: 2.5

## 2013-10-09 ENCOUNTER — Other Ambulatory Visit: Payer: Self-pay | Admitting: *Deleted

## 2013-10-10 MED ORDER — ESCITALOPRAM OXALATE 20 MG PO TABS: ORAL_TABLET | ORAL | Status: DC

## 2013-10-25 ENCOUNTER — Ambulatory Visit (INDEPENDENT_AMBULATORY_CARE_PROVIDER_SITE_OTHER): Payer: Medicare Other

## 2013-10-25 DIAGNOSIS — I4891 Unspecified atrial fibrillation: Secondary | ICD-10-CM

## 2013-10-25 DIAGNOSIS — Z5181 Encounter for therapeutic drug level monitoring: Secondary | ICD-10-CM

## 2013-10-25 DIAGNOSIS — Z7901 Long term (current) use of anticoagulants: Secondary | ICD-10-CM

## 2013-10-25 LAB — POCT INR: INR: 2.2

## 2013-10-26 ENCOUNTER — Ambulatory Visit (INDEPENDENT_AMBULATORY_CARE_PROVIDER_SITE_OTHER): Payer: Medicare Other | Admitting: Internal Medicine

## 2013-10-26 ENCOUNTER — Encounter: Payer: Self-pay | Admitting: Internal Medicine

## 2013-10-26 VITALS — BP 126/66 | HR 70 | Temp 97.5°F | Resp 16 | Wt 171.5 lb

## 2013-10-26 DIAGNOSIS — N189 Chronic kidney disease, unspecified: Secondary | ICD-10-CM

## 2013-10-26 DIAGNOSIS — R531 Weakness: Secondary | ICD-10-CM

## 2013-10-26 DIAGNOSIS — E669 Obesity, unspecified: Secondary | ICD-10-CM

## 2013-10-26 DIAGNOSIS — R5381 Other malaise: Secondary | ICD-10-CM

## 2013-10-26 DIAGNOSIS — R5383 Other fatigue: Secondary | ICD-10-CM

## 2013-10-26 DIAGNOSIS — E785 Hyperlipidemia, unspecified: Secondary | ICD-10-CM

## 2013-10-26 DIAGNOSIS — M25562 Pain in left knee: Secondary | ICD-10-CM

## 2013-10-26 DIAGNOSIS — M25569 Pain in unspecified knee: Secondary | ICD-10-CM

## 2013-10-26 DIAGNOSIS — R197 Diarrhea, unspecified: Secondary | ICD-10-CM

## 2013-10-26 DIAGNOSIS — F0150 Vascular dementia without behavioral disturbance: Secondary | ICD-10-CM

## 2013-10-26 DIAGNOSIS — I10 Essential (primary) hypertension: Secondary | ICD-10-CM

## 2013-10-26 DIAGNOSIS — Z7901 Long term (current) use of anticoagulants: Secondary | ICD-10-CM

## 2013-10-26 NOTE — Patient Instructions (Signed)
Congratulations on losing 21 lbs!!!!!     We are referring you to Dr Sabra Heck to look at your left knee.    Stop your coumadin 2 days prior to your appt in case he wants to inject the knee and  Resume the coumadin after the injection   See you in 6 months

## 2013-10-26 NOTE — Progress Notes (Signed)
Patient ID: Billy Kane, male   DOB: Jun 01, 1923, 80 y.o.   MRN: [id number]   Patient Active Problem List   Diagnosis Date Noted  . Left anterior knee pain 10/28/2013  . Encounter for therapeutic drug monitoring 09/27/2013  . Vascular dementia 08/16/2013  . Diarrhea 04/29/2013  . Shoulder pain, left 04/27/2012  . Chronic back pain 10/26/2011  . Obesity (BMI 30-39.9) 10/26/2011  . Multiple myeloma 06/14/2011  . Hypertension   . Chronic kidney disease   . PUD (peptic ulcer disease)   . Long term current use of anticoagulant 11/07/2010  . CAROTID ARTERY STENOSIS, WITHOUT INFARCTION 10/01/2010  . ATRIAL FIBRILLATION 09/03/2010  . HYPERLIPIDEMIA-MIXED 04/15/2009  . CORONARY ATHEROSCLEROSIS OF ARTERY BYPASS GRAFT 04/15/2009  . CHEST PAIN UNSPECIFIED 04/15/2009  . CORONARY ARTERY BYPASS GRAFT, HX OF 04/15/2009    Subjective:  CC:   Chief Complaint  Patient presents with  . Follow-up    6 month    HPI:   Billy Kane is a 80 y.o. male who presents for Follow up on chronic problems, including dementia, obesity multiple myeloma with CKD and atrial fibrillation on chronic anticoagulation.  He has achieved  an intentional weight loss of 21 lbs since his last visit 3 months ago. He appears to be unaware of the sacrifices he is making since his caregivers prepare and provide his meals at [facility].   He has no particular complaints today but his family reports that his lLeft knee is still givng him a lot of pain ,  And occasionally gives way.  Family is requesting an intra articular injection if appropriate and referral for water therapy since he seems  to be stronger after doing water exercises    Past Medical History  Diagnosis Date  . Coronary artery disease   . Depression   . TIA (transient ischemic attack)   . CAD (coronary artery disease)     s/p AMI 1998  . Lymphocytic leukemia 12/10    small, diagnosed 12/10  . Multiple myeloma     diagnosed with monclonal spike fo 5 gm   . S/P coronary artery bypass graft x 4 1998    [facility], SVG to distal RCA, OM1 D1; LIMA to distal LAD  . Hypertension   . Hyperlipidemia   . Chronic kidney disease     Stage III, secondary to myeloma  . PUD (peptic ulcer disease)     Past Surgical History  Procedure Laterality Date  . Cholecystectomy    . Cholecystectomy  > 20 years ago  . Coronary artery bypass graft         The following portions of the patient's history were reviewed and updated as appropriate: Allergies, current medications, and problem list.    Review of Systems:   Patient denies headache, fevers, malaise, unintentional weight loss, skin rash, eye pain, sinus congestion and sinus pain, sore throat, dysphagia,  hemoptysis , cough, dyspnea, wheezing, chest pain, palpitations, orthopnea, edema, abdominal pain, nausea, melena, diarrhea, constipation, flank pain, dysuria, hematuria, urinary  Frequency, nocturia, numbness, tingling, seizures,  Focal weakness, Loss of consciousness,  Tremor, insomnia, depression, anxiety, and suicidal ideation.     History   Social History  . Marital Status: Widowed    Spouse Name: N/A    Number of Children: N/A  . Years of Education: N/A   Occupational History  . Not on file.   Social History Main Topics  . Smoking status: Never Smoker   . Smokeless tobacco: Never Used  .  Alcohol Use: No  . Drug Use: No  . Sexual Activity: No   Other Topics Concern  . Not on file   Social History Narrative   ** Merged History Encounter **        Objective:  Filed Vitals:   10/26/13 1057  BP: 126/66  Pulse: 70  Temp: 97.5 F (36.4 C)  Resp: 16     General appearance: alert, cooperative and appears stated age Ears: normal TM's and external ear canals both ears Throat: lips, mucosa, and tongue normal; teeth and gums normal Neck: no adenopathy, no carotid bruit, supple, symmetrical, trachea midline and thyroid not enlarged, symmetric, no tenderness/mass/nodules Back:  symmetric, no curvature. ROM normal. No CVA tenderness. Lungs: clear to auscultation bilaterally Heart: regular rate and rhythm, S1, S2 normal, no murmur, click, rub or gallop Abdomen: soft, non-tender; bowel sounds normal; no masses,  no organomegaly Pulses: 2+ and symmetric Skin: Skin color, texture, turgor normal. No rashes or lesions Lymph nodes: Cervical, supraclavicular, and axillary nodes normal. MSK: left knee with crepitus, no effusion.   Assessment and Plan:  Obesity (BMI 30-39.9) Resolved with family's attention to diet.  Has lost 21 lbs in 3 months.  Although has has an abnormal fasting glucose, his a1c rules out dm.   Vascular dementia He is tolerating Namenda and his cognitive deficits have been stable. Did not tolerate arcicept due to persistent diarrhea  Chronic kidney disease There has been no significant decline in renal function over the past year.  Lab Results  Component Value Date   CREATININE 1.5 08/15/2013    Diarrhea Resolved once Aricept was stopped/   Left anterior knee pain Likely DJD, in the absence of trauma or injury.  Given his chronic anticoagulation  And episodes of "giving way," referring to Orthopedics for evaluation.    Long term current use of anticoagulant Since he is most likely going to have an aspiration or injection of the knee, I advised his caregiver to suspend his coumadin for 2 days prior to his Orthoppedic appt   Hypertension Well controlled on current regimen. Renal function stable, no changes today.  Lab Results  Component Value Date   CREATININE 1.5 08/15/2013   Lab Results  Component Value Date   NA 139 08/15/2013   K 4.6 08/15/2013   CL 104 08/15/2013   CO2 29 08/15/2013    HYPERLIPIDEMIA-MIXED Statin continued despite his age due to his history of CAD and PAD with carotid artery stenosis . LFTs normal.  Return for fasting lipids in two months    Lab Results  Component Value Date   ALT 24 08/15/2013   AST 27  08/15/2013   ALKPHOS 56 08/15/2013   BILITOT 0.9 08/15/2013    Updated Medication List Outpatient Encounter Prescriptions as of 10/26/2013  Medication Sig  . acetaminophen (TYLENOL) 500 MG tablet Take 500 mg by mouth 2 (two) times daily.    Marland Kitchen aspirin 81 MG EC tablet Take 81 mg by mouth daily.    Marland Kitchen atorvastatin (LIPITOR) 20 MG tablet TAKE 1 TABLET BY MOUTH DAILY.  Marland Kitchen Cholecalciferol (VITAMIN D-3) 1000 UNITS CAPS Take 1 capsule by mouth daily.  . diphenhydrAMINE (BENADRYL) 25 MG tablet Take 25 mg by mouth at bedtime.  Mariane Baumgarten Sodium (STOOL SOFTENER) 100 MG capsule Take 100 mg by mouth 2 (two) times daily as needed.    . enalapril (VASOTEC) 2.5 MG tablet Take 2.5 mg by mouth daily.  Marland Kitchen escitalopram (LEXAPRO) 20 MG tablet TAKE 1 TABLET  BY MOUTH DAILY.  . fentaNYL (DURAGESIC - DOSED MCG/HR) 12 MCG/HR Place 1 patch onto the skin every other day.   . finasteride (PROSCAR) 5 MG tablet Take 1 tablet (5 mg total) by mouth daily.  Marland Kitchen guaiFENesin (MUCINEX) 600 MG 12 hr tablet Take 1,200 mg by mouth 2 (two) times daily.  . Lenalidomide (REVLIMID) 5 MG CAPS Take 1 capsule by mouth as directed. 1 capsule for 21 days then 7 days off then repeat   . memantine (NAMENDA TITRATION PAK) tablet pack 5 mg/day for =1 week; 5 mg twice daily for =1 week; 15 mg/day given in 5 mg and 10 mg separated doses for =1 week; then 10 mg twice daily  . memantine (NAMENDA) 10 MG tablet Take 1 tablet (10 mg total) by mouth 2 (two) times daily.  . methocarbamol (ROBAXIN) 500 MG tablet Take 1/2 to 1 tablet by mouth every 12 hours, as needed   . metoprolol tartrate (LOPRESSOR) 25 MG tablet TAKE 1/2 TABLET BY MOUTH 2 TIMES DAILY  . Multiple Vitamin (MULTIVITAMIN) tablet Take 1 tablet by mouth daily.  . polyethylene glycol powder (GLYCOLAX/MIRALAX) powder Take 17 g by mouth 2 (two) times daily as needed.  . tamsulosin (FLOMAX) 0.4 MG CAPS capsule TAKE 1 CAPSULE EVERY DAY  . TDaP (BOOSTRIX) 5-2.5-18.5 LF-MCG/0.5 injection Inject 0.5  mLs into the muscle once.  . warfarin (COUMADIN) 5 MG tablet TAKE 1 TABLET AS DIRECTED

## 2013-10-28 DIAGNOSIS — M25562 Pain in left knee: Secondary | ICD-10-CM | POA: Insufficient documentation

## 2013-10-28 NOTE — Assessment & Plan Note (Addendum)
He is tolerating Namenda and his cognitive deficits have been stable. Did not tolerate arcicept due to persistent diarrhea

## 2013-10-28 NOTE — Assessment & Plan Note (Signed)
Since he is most likely going to have an aspiration or injection of the knee, I advised his caregiver to suspend his coumadin for 2 days prior to his Orthoppedic appt

## 2013-10-28 NOTE — Assessment & Plan Note (Signed)
Well controlled on current regimen. Renal function stable, no changes today.  Lab Results  Component Value Date   CREATININE 1.5 08/15/2013   Lab Results  Component Value Date   NA 139 08/15/2013   K 4.6 08/15/2013   CL 104 08/15/2013   CO2 29 08/15/2013

## 2013-10-28 NOTE — Assessment & Plan Note (Signed)
Resolved once Aricept was stopped/

## 2013-10-28 NOTE — Assessment & Plan Note (Signed)
Statin continued despite his age due to his history of CAD and PAD with carotid artery stenosis . LFTs normal.  Return for fasting lipids in two months    Lab Results  Component Value Date   ALT 24 08/15/2013   AST 27 08/15/2013   ALKPHOS 56 08/15/2013   BILITOT 0.9 08/15/2013

## 2013-10-28 NOTE — Assessment & Plan Note (Signed)
Likely DJD, in the absence of trauma or injury.  Given his chronic anticoagulation  And episodes of "giving way," referring to Orthopedics for evaluation.

## 2013-10-28 NOTE — Assessment & Plan Note (Addendum)
Resolved with family's attention to diet.  Has lost 21 lbs in 3 months.  Although has has an abnormal fasting glucose, his a1c rules out dm.

## 2013-10-28 NOTE — Assessment & Plan Note (Signed)
There has been no significant decline in renal function over the past year.  Lab Results  Component Value Date   CREATININE 1.5 08/15/2013

## 2013-10-31 ENCOUNTER — Ambulatory Visit: Payer: Self-pay | Admitting: Oncology

## 2013-10-31 LAB — CBC CANCER CENTER
BASOS ABS: 0 x10 3/mm (ref 0.0–0.1)
BASOS PCT: 0 %
EOS PCT: 0 %
Eosinophil #: 0 x10 3/mm (ref 0.0–0.7)
HCT: 38 % — ABNORMAL LOW (ref 40.0–52.0)
HGB: 12.2 g/dL — AB (ref 13.0–18.0)
LYMPHS PCT: 12.3 %
Lymphocyte #: 1.5 x10 3/mm (ref 1.0–3.6)
MCH: 27.3 pg (ref 26.0–34.0)
MCHC: 32.2 g/dL (ref 32.0–36.0)
MCV: 85 fL (ref 80–100)
MONOS PCT: 7.3 %
Monocyte #: 0.9 x10 3/mm (ref 0.2–1.0)
Neutrophil #: 9.9 x10 3/mm — ABNORMAL HIGH (ref 1.4–6.5)
Neutrophil %: 80.4 %
PLATELETS: 184 x10 3/mm (ref 150–440)
RBC: 4.48 10*6/uL (ref 4.40–5.90)
RDW: 16.1 % — AB (ref 11.5–14.5)
WBC: 12.4 x10 3/mm — ABNORMAL HIGH (ref 3.8–10.6)

## 2013-10-31 LAB — COMPREHENSIVE METABOLIC PANEL
ALBUMIN: 3.4 g/dL (ref 3.4–5.0)
ALK PHOS: 68 U/L
ALT: 21 U/L (ref 12–78)
Anion Gap: 13 (ref 7–16)
BUN: 21 mg/dL — AB (ref 7–18)
Bilirubin,Total: 0.5 mg/dL (ref 0.2–1.0)
CHLORIDE: 104 mmol/L (ref 98–107)
Calcium, Total: 8.6 mg/dL (ref 8.5–10.1)
Co2: 22 mmol/L (ref 21–32)
Creatinine: 1.5 mg/dL — ABNORMAL HIGH (ref 0.60–1.30)
EGFR (African American): 47 — ABNORMAL LOW
EGFR (Non-African Amer.): 41 — ABNORMAL LOW
Glucose: 165 mg/dL — ABNORMAL HIGH (ref 65–99)
OSMOLALITY: 284 (ref 275–301)
Potassium: 3.9 mmol/L (ref 3.5–5.1)
SGOT(AST): 19 U/L (ref 15–37)
Sodium: 139 mmol/L (ref 136–145)
TOTAL PROTEIN: 6.5 g/dL (ref 6.4–8.2)

## 2013-11-01 LAB — KAPPA/LAMBDA FREE LIGHT CHAINS (ARMC)

## 2013-11-01 LAB — PROT IMMUNOELECTROPHORES(ARMC)

## 2013-11-08 ENCOUNTER — Encounter: Payer: Self-pay | Admitting: Cardiology

## 2013-11-08 ENCOUNTER — Ambulatory Visit (INDEPENDENT_AMBULATORY_CARE_PROVIDER_SITE_OTHER): Payer: Medicare Other | Admitting: Cardiology

## 2013-11-08 VITALS — BP 132/68 | HR 65 | Ht 66.0 in | Wt 189.0 lb

## 2013-11-08 DIAGNOSIS — R0609 Other forms of dyspnea: Secondary | ICD-10-CM

## 2013-11-08 DIAGNOSIS — I4891 Unspecified atrial fibrillation: Secondary | ICD-10-CM

## 2013-11-08 DIAGNOSIS — R0600 Dyspnea, unspecified: Secondary | ICD-10-CM

## 2013-11-08 DIAGNOSIS — R9431 Abnormal electrocardiogram [ECG] [EKG]: Secondary | ICD-10-CM

## 2013-11-08 DIAGNOSIS — R0989 Other specified symptoms and signs involving the circulatory and respiratory systems: Secondary | ICD-10-CM

## 2013-11-08 NOTE — Progress Notes (Signed)
Patient Name: Billy Kane Date of Encounter: 11/08/2013  Primary Care Provider:  Deborra Medina, MD Primary Cardiologist:  Dorothy Spark  Problem List   Past Medical History  Diagnosis Date  . Coronary artery disease   . Depression   . TIA (transient ischemic attack)   . CAD (coronary artery disease)     s/p AMI 1998  . Lymphocytic leukemia 12/10    small, diagnosed 12/10  . Multiple myeloma     diagnosed with monclonal spike fo 5 gm  . S/P coronary artery bypass graft x 4 1998    [facility], SVG to distal RCA, OM1 D1; LIMA to distal LAD  . Hypertension   . Hyperlipidemia   . Chronic kidney disease     Stage III, secondary to myeloma  . PUD (peptic ulcer disease)    Past Surgical History  Procedure Laterality Date  . Cholecystectomy    . Cholecystectomy  > 20 years ago  . Coronary artery bypass graft      Allergies  Allergies  Allergen Reactions  . Aricept [Donepezil Hcl] Diarrhea    HPI  Mr Bartel returns today for evaluation and management of his coronary artery disease, history of myocardial infarction, s/p CABG in 1998 and paroxysmal A. fib.   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 The patient denies any chest pain but has some exertional dyspnea on moderate exertion. He denies any lower extremity edema, palpitations, syncope, orthopnea or personal nocturnal dyspnea.  He is very compliant with his meds and brings a list of his blood pressure measurements. His brothers are concerned about episodes of hypertension when he feels tired and slightly dizzy.  Home Medications  Prior to Admission medications   Medication Sig Start Date End Date Taking? Authorizing Provider  acetaminophen (TYLENOL) 500 MG tablet Take 500 mg by mouth 2 (two) times daily.     Yes Historical Provider, MD  aspirin 81 MG EC tablet Take 81 mg by mouth daily.     Yes Historical Provider, MD  atorvastatin (LIPITOR) 20 MG tablet  TAKE 1 TABLET BY MOUTH DAILY. 09/25/13  Yes Dorothy Spark, MD  Cholecalciferol (VITAMIN D-3) 1000 UNITS CAPS Take 1 capsule by mouth daily.   Yes Historical Provider, MD  diphenhydrAMINE (BENADRYL) 25 MG tablet Take 25 mg by mouth at bedtime.   Yes Historical Provider, MD  Docusate Sodium (STOOL SOFTENER) 100 MG capsule Take 100 mg by mouth 2 (two) times daily as needed.     Yes Historical Provider, MD  enalapril (VASOTEC) 2.5 MG tablet Take 2.5 mg by mouth daily.   Yes Historical Provider, MD  escitalopram (LEXAPRO) 20 MG tablet TAKE 1 TABLET BY MOUTH DAILY. 10/10/13  Yes Crecencio Mc, MD  fentaNYL (DURAGESIC - DOSED MCG/HR) 12 MCG/HR Place 1 patch onto the skin every other day.    Yes Historical Provider, MD  finasteride (PROSCAR) 5 MG tablet Take 1 tablet (5 mg total) by mouth daily. 08/15/13  Yes Crecencio Mc, MD  guaiFENesin (MUCINEX) 600 MG 12 hr tablet Take 1,200 mg by mouth 2 (two) times daily.   Yes Historical Provider, MD  Lenalidomide (REVLIMID) 5 MG CAPS Take 1 capsule by mouth as directed. 1 capsule for 21 days then 7 days off then repeat    Yes Historical Provider, MD  memantine (NAMENDA) 10 MG tablet Take 1 tablet (10 mg total) by mouth 2 (two) times daily. 08/15/13  Yes Crecencio Mc, MD  methocarbamol (ROBAXIN) 500 MG tablet Take 1/2 to 1 tablet by mouth every 12 hours, as needed    Yes Historical Provider, MD  metoprolol tartrate (LOPRESSOR) 25 MG tablet TAKE 1/2 TABLET BY MOUTH 2 TIMES DAILY 09/25/13  Yes Crecencio Mc, MD  Multiple Vitamin (MULTIVITAMIN) tablet Take 1 tablet by mouth daily.   Yes Historical Provider, MD  polyethylene glycol powder (GLYCOLAX/MIRALAX) powder Take 17 g by mouth 2 (two) times daily as needed. 04/27/13  Yes Crecencio Mc, MD  tamsulosin (FLOMAX) 0.4 MG CAPS capsule TAKE 1 CAPSULE EVERY DAY 04/06/13  Yes Crecencio Mc, MD  TDaP Durwin Reges) 5-2.5-18.5 LF-MCG/0.5 injection Inject 0.5 mLs into the muscle once. 10/25/12  Yes Crecencio Mc, MD  warfarin  (COUMADIN) 5 MG tablet TAKE 1 TABLET AS DIRECTED 08/22/13  Yes Dorothy Spark, MD   Family History  Family History  Problem Relation Age of Onset  . Coronary artery disease Father   . Heart attack Father 49  . Coronary artery disease Brother   . Heart disease Mother     CAD   . Cancer Brother    Social History  History   Social History  . Marital Status: Widowed    Spouse Name: N/A    Number of Children: N/A  . Years of Education: N/A   Occupational History  . Not on file.   Social History Main Topics  . Smoking status: Never Smoker   . Smokeless tobacco: Never Used  . Alcohol Use: No  . Drug Use: No  . Sexual Activity: No   Other Topics Concern  . Not on file   Social History Narrative   ** Merged History Encounter **        Review of Systems, as per HPI, otherwise negative General:  No chills, fever, night sweats or weight changes.  Cardiovascular:  No chest pain, dyspnea on exertion, edema, orthopnea, palpitations, paroxysmal nocturnal dyspnea. Dermatological: No rash, lesions/masses Respiratory: No cough, dyspnea Urologic: No hematuria, dysuria Abdominal:   No nausea, vomiting, diarrhea, bright red blood per rectum, melena, or hematemesis Neurologic:  No visual changes, wkns, changes in mental status. All other systems reviewed and are otherwise negative except as noted above.  Physical Exam  Blood pressure 132/68, height 5' 6" (1.676 m), weight 189 lb (85.73 kg).  General: Pleasant, NAD Psych: Normal affect. Neuro: Alert and oriented X 3. Moves all extremities spontaneously. HEENT: Normal  Neck: Supple without bruits or JVD. Lungs:  Resp regular and unlabored, CTA. Heart: RRR no s3, s4, or murmurs. Abdomen: Soft, non-tender, non-distended, BS + x 4.  Extremities: No clubbing, cyanosis or edema. DP/PT/Radials 2+ and equal bilaterally.  Labs:  No results found for this basename: CKTOTAL, CKMB, TROPONINI,  in the last 72 hours Lab Results    Component Value Date   WBC 10.3 04/24/2011   HGB 11.9* 04/24/2011   HCT 36.0* 04/24/2011   MCV 88.7 04/24/2011   PLT 181.0 04/24/2011   Lab Results  Component Value Date   CHOL 179 10/25/2008   HDL 51 11/08/2012   LDLCALC 58 11/08/2012   TRIG 191* 11/08/2012   Accessory Clinical Findings  Echocardiogram - none  ECG - sinus rhythm with first-degree AV block, PACs, LVH, and you negative T waves in inferolateral leads when compared to the EKG from 11/04/2011, inferior infarct age undetermined.   Assessment & Plan  A very pleasant 80 year old gentleman  1. CAD, status post CABG  in 1998 at [facility], the patient has mild exertional dyspnea and new T waves inversions in inferolateral leads when compared to the EKG from 2013. We will order an exercise nuclear stress test to evaluate for ischemia.  2. Hypertension - controlled majority of the time however worked her episodes of hypotension but also episodes of hyper-tension up to 190. The patient is advised to hydrate very well especially in hot days and hold metoprolol on days that his systolic blood pressures below 100.  3. Hyperlipidemia - LDL and HDL at goal, triglycerides mildly elevated he is advised to add fish oil to his regimen.  Followup in one year unless abnormal stress test.  Dorothy Spark, MD, [facility] 11/08/2013, 11:43 AM

## 2013-11-08 NOTE — Patient Instructions (Signed)
Your physician has requested that you have a lexiscan myoview. For further information please visit [email or web address]. Please follow instruction sheet, as given.  Your physician wants you to follow-up in: 1 year with Dr. Meda Coffee. You will receive a reminder letter in the mail two months in advance. If you don't receive a letter, please call our office to schedule the follow-up appointment.

## 2013-11-09 ENCOUNTER — Ambulatory Visit: Payer: Medicare Other | Admitting: Cardiology

## 2013-11-09 ENCOUNTER — Encounter: Payer: Self-pay | Admitting: Internal Medicine

## 2013-11-10 LAB — CBC
HCT: 39.1 % — ABNORMAL LOW (ref 40.0–52.0)
HGB: 12.8 g/dL — ABNORMAL LOW (ref 13.0–18.0)
MCH: 28.4 pg (ref 26.0–34.0)
MCHC: 32.6 g/dL (ref 32.0–36.0)
MCV: 87 fL (ref 80–100)
Platelet: 120 10*3/uL — ABNORMAL LOW (ref 150–440)
RBC: 4.49 10*6/uL (ref 4.40–5.90)
RDW: 16.4 % — AB (ref 11.5–14.5)
WBC: 11 10*3/uL — ABNORMAL HIGH (ref 3.8–10.6)

## 2013-11-10 LAB — BASIC METABOLIC PANEL
ANION GAP: 6 — AB (ref 7–16)
BUN: 15 mg/dL (ref 7–18)
CREATININE: 1.48 mg/dL — AB (ref 0.60–1.30)
Calcium, Total: 8.1 mg/dL — ABNORMAL LOW (ref 8.5–10.1)
Chloride: 105 mmol/L (ref 98–107)
Co2: 26 mmol/L (ref 21–32)
EGFR (Non-African Amer.): 41 — ABNORMAL LOW
GFR CALC AF AMER: 48 — AB
GLUCOSE: 134 mg/dL — AB (ref 65–99)
Osmolality: 277 (ref 275–301)
Potassium: 3.7 mmol/L (ref 3.5–5.1)
Sodium: 137 mmol/L (ref 136–145)

## 2013-11-10 LAB — PROTIME-INR
INR: 2
PROTHROMBIN TIME: 22 s — AB (ref 11.5–14.7)

## 2013-11-11 ENCOUNTER — Observation Stay: Payer: Self-pay | Admitting: Internal Medicine

## 2013-11-11 LAB — URINALYSIS, COMPLETE
Bacteria: NONE SEEN
Bilirubin,UR: NEGATIVE
Blood: NEGATIVE
Glucose,UR: NEGATIVE mg/dL (ref 0–75)
Ketone: NEGATIVE
Leukocyte Esterase: NEGATIVE
Nitrite: NEGATIVE
PH: 6 (ref 4.5–8.0)
RBC,UR: 1 /HPF (ref 0–5)
SPECIFIC GRAVITY: 1.019 (ref 1.003–1.030)
SQUAMOUS EPITHELIAL: NONE SEEN
WBC UR: 2 /HPF (ref 0–5)

## 2013-11-11 LAB — CK-MB
CK-MB: 1 ng/mL
CK-MB: 1.1 ng/mL
CK-MB: 1.4 ng/mL (ref 0.5–3.6)

## 2013-11-11 LAB — TROPONIN I
TROPONIN-I: 0.2 ng/mL — AB
Troponin-I: 0.26 ng/mL — ABNORMAL HIGH
Troponin-I: 0.28 ng/mL — ABNORMAL HIGH

## 2013-11-11 LAB — HEPATIC FUNCTION PANEL A (ARMC)
ALBUMIN: 3.3 g/dL — AB (ref 3.4–5.0)
AST: 21 U/L (ref 15–37)
Alkaline Phosphatase: 71 U/L
BILIRUBIN TOTAL: 0.5 mg/dL (ref 0.2–1.0)
SGPT (ALT): 27 U/L (ref 12–78)
Total Protein: 6.6 g/dL (ref 6.4–8.2)

## 2013-11-11 LAB — PRO B NATRIURETIC PEPTIDE: B-TYPE NATIURETIC PEPTID: 1485 pg/mL — AB (ref 0–450)

## 2013-11-12 LAB — CBC WITH DIFFERENTIAL/PLATELET
Basophil #: 0 10*3/uL (ref 0.0–0.1)
Basophil %: 0.1 %
EOS ABS: 0 10*3/uL (ref 0.0–0.7)
Eosinophil %: 0 %
HCT: 34.4 % — ABNORMAL LOW (ref 40.0–52.0)
HGB: 11.6 g/dL — ABNORMAL LOW (ref 13.0–18.0)
LYMPHS PCT: 13.1 %
Lymphocyte #: 1.4 10*3/uL (ref 1.0–3.6)
MCH: 29 pg (ref 26.0–34.0)
MCHC: 33.8 g/dL (ref 32.0–36.0)
MCV: 86 fL (ref 80–100)
MONO ABS: 1.5 x10 3/mm — AB (ref 0.2–1.0)
Monocyte %: 14 %
NEUTROS ABS: 7.8 10*3/uL — AB (ref 1.4–6.5)
NEUTROS PCT: 72.8 %
PLATELETS: 107 10*3/uL — AB (ref 150–440)
RBC: 4.01 10*6/uL — ABNORMAL LOW (ref 4.40–5.90)
RDW: 16.5 % — ABNORMAL HIGH (ref 11.5–14.5)
WBC: 10.7 10*3/uL — AB (ref 3.8–10.6)

## 2013-11-12 LAB — BASIC METABOLIC PANEL
Anion Gap: 7 (ref 7–16)
BUN: 12 mg/dL (ref 7–18)
CHLORIDE: 108 mmol/L — AB (ref 98–107)
Calcium, Total: 8.4 mg/dL — ABNORMAL LOW (ref 8.5–10.1)
Co2: 26 mmol/L (ref 21–32)
Creatinine: 1.33 mg/dL — ABNORMAL HIGH (ref 0.60–1.30)
GFR CALC AF AMER: 55 — AB
GFR CALC NON AF AMER: 47 — AB
GLUCOSE: 115 mg/dL — AB (ref 65–99)
Osmolality: 282 (ref 275–301)
Potassium: 3.8 mmol/L (ref 3.5–5.1)
SODIUM: 141 mmol/L (ref 136–145)

## 2013-11-12 LAB — PROTIME-INR
INR: 1.7
Prothrombin Time: 19.8 secs — ABNORMAL HIGH (ref 11.5–14.7)

## 2013-11-15 ENCOUNTER — Ambulatory Visit: Payer: Self-pay | Admitting: Oncology

## 2013-11-15 LAB — CULTURE, BLOOD (SINGLE)

## 2013-11-22 ENCOUNTER — Ambulatory Visit (INDEPENDENT_AMBULATORY_CARE_PROVIDER_SITE_OTHER): Payer: Medicare Other

## 2013-11-22 DIAGNOSIS — I4891 Unspecified atrial fibrillation: Secondary | ICD-10-CM

## 2013-11-22 DIAGNOSIS — Z5181 Encounter for therapeutic drug level monitoring: Secondary | ICD-10-CM

## 2013-11-22 DIAGNOSIS — Z7901 Long term (current) use of anticoagulants: Secondary | ICD-10-CM

## 2013-11-22 LAB — POCT INR: INR: 2.6

## 2013-11-27 ENCOUNTER — Encounter (HOSPITAL_COMMUNITY): Payer: Medicare Other

## 2013-11-29 ENCOUNTER — Ambulatory Visit (INDEPENDENT_AMBULATORY_CARE_PROVIDER_SITE_OTHER): Payer: Medicare Other

## 2013-11-29 DIAGNOSIS — I4891 Unspecified atrial fibrillation: Secondary | ICD-10-CM

## 2013-11-29 DIAGNOSIS — Z5181 Encounter for therapeutic drug level monitoring: Secondary | ICD-10-CM

## 2013-11-29 DIAGNOSIS — Z7901 Long term (current) use of anticoagulants: Secondary | ICD-10-CM

## 2013-11-29 LAB — POCT INR: INR: 3.6

## 2013-12-01 ENCOUNTER — Telehealth: Payer: Self-pay

## 2013-12-01 ENCOUNTER — Telehealth: Payer: Self-pay | Admitting: Internal Medicine

## 2013-12-01 MED ORDER — BUDESONIDE-FORMOTEROL FUMARATE 80-4.5 MCG/ACT IN AERO: 2.0000 | INHALATION_SPRAY | Freq: Two times a day (BID) | RESPIRATORY_TRACT | Status: AC

## 2013-12-01 NOTE — Telephone Encounter (Signed)
Spoke w/ Wynetta Fines.  Advised her to contact Dr. Derrel Nip regarding pt's med issues.

## 2013-12-01 NOTE — Telephone Encounter (Signed)
Pt was seen at [facility] and was prescribed symbicort, pt needs a refill on this, pt caretaker, Eula Listen,  called and is not sure who to call for this refill. Please call.

## 2013-12-01 NOTE — Telephone Encounter (Signed)
Laverta Baltimore called wanting to get a rx for symbicort 160/4.5  She stated that mr Fretz received this rx when he was in the hospital  She called cardololgy they could call this in because he has not been seen in the office  Pt is compeltely out of meds [facility] [address]  Please call when this rx has been called

## 2013-12-01 NOTE — Telephone Encounter (Signed)
Please advise ok to call?

## 2013-12-13 ENCOUNTER — Ambulatory Visit (INDEPENDENT_AMBULATORY_CARE_PROVIDER_SITE_OTHER): Payer: Medicare Other | Admitting: *Deleted

## 2013-12-13 DIAGNOSIS — I4891 Unspecified atrial fibrillation: Secondary | ICD-10-CM

## 2013-12-13 DIAGNOSIS — Z5181 Encounter for therapeutic drug level monitoring: Secondary | ICD-10-CM

## 2013-12-13 DIAGNOSIS — Z7901 Long term (current) use of anticoagulants: Secondary | ICD-10-CM

## 2013-12-13 LAB — POCT INR: INR: 2.7

## 2013-12-25 ENCOUNTER — Telehealth: Payer: Self-pay | Admitting: Internal Medicine

## 2013-12-25 DIAGNOSIS — R531 Weakness: Secondary | ICD-10-CM

## 2013-12-25 NOTE — Telephone Encounter (Signed)
Ordred,  Please print and sent to [facility]

## 2013-12-25 NOTE — Telephone Encounter (Signed)
Please advise I did not think this was offered at [facility]?

## 2013-12-25 NOTE — Telephone Encounter (Signed)
Needing an order sent to the [facility] before Wednesday 5.13.15.

## 2013-12-26 NOTE — Telephone Encounter (Signed)
Order faxed as requested

## 2013-12-27 ENCOUNTER — Ambulatory Visit: Payer: Self-pay | Admitting: Oncology

## 2013-12-27 ENCOUNTER — Ambulatory Visit (INDEPENDENT_AMBULATORY_CARE_PROVIDER_SITE_OTHER): Payer: Medicare Other

## 2013-12-27 DIAGNOSIS — Z5181 Encounter for therapeutic drug level monitoring: Secondary | ICD-10-CM

## 2013-12-27 DIAGNOSIS — Z7901 Long term (current) use of anticoagulants: Secondary | ICD-10-CM

## 2013-12-27 DIAGNOSIS — I4891 Unspecified atrial fibrillation: Secondary | ICD-10-CM

## 2013-12-27 LAB — COMPREHENSIVE METABOLIC PANEL
ALBUMIN: 3.4 g/dL (ref 3.4–5.0)
AST: 20 U/L (ref 15–37)
Alkaline Phosphatase: 67 U/L
Anion Gap: 6 — ABNORMAL LOW (ref 7–16)
BUN: 14 mg/dL (ref 7–18)
Bilirubin,Total: 0.5 mg/dL (ref 0.2–1.0)
CALCIUM: 8.7 mg/dL (ref 8.5–10.1)
CHLORIDE: 106 mmol/L (ref 98–107)
CO2: 31 mmol/L (ref 21–32)
Creatinine: 1.4 mg/dL — ABNORMAL HIGH (ref 0.60–1.30)
EGFR (African American): 51 — ABNORMAL LOW
GFR CALC NON AF AMER: 44 — AB
Glucose: 96 mg/dL (ref 65–99)
Osmolality: 285 (ref 275–301)
POTASSIUM: 3.8 mmol/L (ref 3.5–5.1)
SGPT (ALT): 25 U/L (ref 12–78)
Sodium: 143 mmol/L (ref 136–145)
TOTAL PROTEIN: 6.8 g/dL (ref 6.4–8.2)

## 2013-12-27 LAB — CBC CANCER CENTER
Basophil #: 0 x10 3/mm (ref 0.0–0.1)
Basophil %: 0.8 %
EOS ABS: 0.2 x10 3/mm (ref 0.0–0.7)
Eosinophil %: 2.9 %
HCT: 37.7 % — AB (ref 40.0–52.0)
HGB: 12.3 g/dL — ABNORMAL LOW (ref 13.0–18.0)
Lymphocyte #: 1.8 x10 3/mm (ref 1.0–3.6)
Lymphocyte %: 29.7 %
MCH: 28.7 pg (ref 26.0–34.0)
MCHC: 32.7 g/dL (ref 32.0–36.0)
MCV: 88 fL (ref 80–100)
Monocyte #: 0.7 x10 3/mm (ref 0.2–1.0)
Monocyte %: 11.8 %
NEUTROS ABS: 3.2 x10 3/mm (ref 1.4–6.5)
Neutrophil %: 54.8 %
Platelet: 146 x10 3/mm — ABNORMAL LOW (ref 150–440)
RBC: 4.31 10*6/uL — ABNORMAL LOW (ref 4.40–5.90)
RDW: 17.4 % — ABNORMAL HIGH (ref 11.5–14.5)
WBC: 5.9 x10 3/mm (ref 3.8–10.6)

## 2013-12-27 LAB — POCT INR: INR: 2.2

## 2013-12-28 LAB — KAPPA/LAMBDA FREE LIGHT CHAINS (ARMC)

## 2013-12-28 LAB — PROT IMMUNOELECTROPHORES(ARMC)

## 2013-12-29 ENCOUNTER — Encounter: Payer: Self-pay | Admitting: Internal Medicine

## 2014-01-04 ENCOUNTER — Other Ambulatory Visit: Payer: Self-pay | Admitting: *Deleted

## 2014-01-04 MED ORDER — ATORVASTATIN CALCIUM 20 MG PO TABS: ORAL_TABLET | ORAL | Status: AC

## 2014-01-09 ENCOUNTER — Other Ambulatory Visit: Payer: Self-pay | Admitting: *Deleted

## 2014-01-10 MED ORDER — ESCITALOPRAM OXALATE 20 MG PO TABS
ORAL_TABLET | ORAL | Status: DC
Start: ? — End: 2014-04-01

## 2014-01-11 ENCOUNTER — Other Ambulatory Visit: Payer: Self-pay | Admitting: *Deleted

## 2014-01-15 ENCOUNTER — Ambulatory Visit: Payer: Self-pay | Admitting: Oncology

## 2014-01-15 ENCOUNTER — Other Ambulatory Visit: Payer: Self-pay | Admitting: *Deleted

## 2014-01-15 ENCOUNTER — Encounter: Payer: Self-pay | Admitting: Internal Medicine

## 2014-01-17 ENCOUNTER — Ambulatory Visit (INDEPENDENT_AMBULATORY_CARE_PROVIDER_SITE_OTHER): Payer: Medicare Other

## 2014-01-17 DIAGNOSIS — I4891 Unspecified atrial fibrillation: Secondary | ICD-10-CM

## 2014-01-17 DIAGNOSIS — Z7901 Long term (current) use of anticoagulants: Secondary | ICD-10-CM

## 2014-01-17 DIAGNOSIS — Z5181 Encounter for therapeutic drug level monitoring: Secondary | ICD-10-CM

## 2014-01-17 LAB — POCT INR: INR: 2.5

## 2014-01-22 ENCOUNTER — Telehealth: Payer: Self-pay | Admitting: Internal Medicine

## 2014-01-22 ENCOUNTER — Emergency Department: Payer: Self-pay | Admitting: Emergency Medicine

## 2014-01-22 LAB — COMPREHENSIVE METABOLIC PANEL
ALT: 25 U/L (ref 12–78)
ANION GAP: 7 (ref 7–16)
AST: 31 U/L (ref 15–37)
Albumin: 3.3 g/dL — ABNORMAL LOW (ref 3.4–5.0)
Alkaline Phosphatase: 61 U/L
BUN: 20 mg/dL — AB (ref 7–18)
Bilirubin,Total: 0.5 mg/dL (ref 0.2–1.0)
CHLORIDE: 110 mmol/L — AB (ref 98–107)
CO2: 25 mmol/L (ref 21–32)
Calcium, Total: 8.9 mg/dL (ref 8.5–10.1)
Creatinine: 1.57 mg/dL — ABNORMAL HIGH (ref 0.60–1.30)
EGFR (African American): 45 — ABNORMAL LOW
GFR CALC NON AF AMER: 39 — AB
Glucose: 100 mg/dL — ABNORMAL HIGH (ref 65–99)
Osmolality: 286 (ref 275–301)
Potassium: 4 mmol/L (ref 3.5–5.1)
SODIUM: 142 mmol/L (ref 136–145)
Total Protein: 7.4 g/dL (ref 6.4–8.2)

## 2014-01-22 LAB — URINALYSIS, COMPLETE
BACTERIA: NONE SEEN
BLOOD: NEGATIVE
Bilirubin,UR: NEGATIVE
GLUCOSE, UR: NEGATIVE mg/dL (ref 0–75)
KETONE: NEGATIVE
LEUKOCYTE ESTERASE: NEGATIVE
NITRITE: NEGATIVE
Ph: 5 (ref 4.5–8.0)
SQUAMOUS EPITHELIAL: NONE SEEN
Specific Gravity: 1.025 (ref 1.003–1.030)

## 2014-01-22 LAB — CBC WITH DIFFERENTIAL/PLATELET
BASOS PCT: 0.8 %
Basophil #: 0 10*3/uL (ref 0.0–0.1)
Eosinophil #: 0.2 10*3/uL (ref 0.0–0.7)
Eosinophil %: 2.9 %
HCT: 38.8 % — AB (ref 40.0–52.0)
HGB: 12.1 g/dL — ABNORMAL LOW (ref 13.0–18.0)
LYMPHS ABS: 2.4 10*3/uL (ref 1.0–3.6)
LYMPHS PCT: 40 %
MCH: 27.7 pg (ref 26.0–34.0)
MCHC: 31.3 g/dL — ABNORMAL LOW (ref 32.0–36.0)
MCV: 89 fL (ref 80–100)
MONO ABS: 1 x10 3/mm (ref 0.2–1.0)
Monocyte %: 16.1 %
NEUTROS ABS: 2.4 10*3/uL (ref 1.4–6.5)
Neutrophil %: 40.2 %
PLATELETS: 169 10*3/uL (ref 150–440)
RBC: 4.38 10*6/uL — ABNORMAL LOW (ref 4.40–5.90)
RDW: 17.1 % — ABNORMAL HIGH (ref 11.5–14.5)
WBC: 6 10*3/uL (ref 3.8–10.6)

## 2014-01-22 LAB — TROPONIN I
TROPONIN-I: 0.05 ng/mL
Troponin-I: 0.05 ng/mL

## 2014-01-22 NOTE — Telephone Encounter (Signed)
FYI

## 2014-01-22 NOTE — Telephone Encounter (Signed)
Patient Information:  Caller Name: Wynetta Fines  Phone: [phone]  Patient: Billy Kane, Billy Kane  Gender: Male  DOB: 08/15/1923  Age: 80 Years  PCP: Deborra Medina (Adults only)  Office Follow Up:  Does the office need to follow up with this patient?: No  Instructions For The Office: N/A  RN Note:  Advised caller to take patient to [facility] ER at this time for evaluation. Caller verbalized understanding and will take patient to ER at this time for evaluation/treatment.  Symptoms  Reason For Call & Symptoms: Patient had fainting episode. Has dementia and does not remember this happening now. Care giver and helper caught him so patient did not have a fall. Has been able to eat like normal after the fainting episode. Reports grips are equal. Reports blood pressure 67/43 this morning with heart rate 118. Now BP is 130/63 HR 60. Reports patient seems to feel weaker than normal today.  Reviewed Health History In EMR: Yes  Reviewed Medications In EMR: Yes  Reviewed Allergies In EMR: Yes  Reviewed Surgeries / Procedures: Yes  Date of Onset of Symptoms: 01/22/2014  Guideline(s) Used:  Fainting  Disposition Per Guideline:   Go to ED Now  Reason For Disposition Reached:   Fainted > 15 minutes ago and still feels weak or dizzy  Advice Given:  N/A  Patient Will Follow Care Advice:  YES

## 2014-01-23 ENCOUNTER — Ambulatory Visit (INDEPENDENT_AMBULATORY_CARE_PROVIDER_SITE_OTHER): Payer: Medicare Other | Admitting: Cardiovascular Disease

## 2014-01-23 ENCOUNTER — Telehealth: Payer: Self-pay | Admitting: *Deleted

## 2014-01-23 ENCOUNTER — Encounter: Payer: Self-pay | Admitting: Cardiovascular Disease

## 2014-01-23 VITALS — BP 110/70 | HR 64 | Ht 65.0 in | Wt 191.0 lb

## 2014-01-23 DIAGNOSIS — R55 Syncope and collapse: Secondary | ICD-10-CM

## 2014-01-23 DIAGNOSIS — I4891 Unspecified atrial fibrillation: Secondary | ICD-10-CM

## 2014-01-23 DIAGNOSIS — Z951 Presence of aortocoronary bypass graft: Secondary | ICD-10-CM

## 2014-01-23 MED ORDER — METOPROLOL TARTRATE 25 MG PO TABS: ORAL_TABLET | ORAL | Status: AC

## 2014-01-23 NOTE — Telephone Encounter (Signed)
Traci will schedule patient with Dr. Acie Fredrickson today.

## 2014-01-23 NOTE — Progress Notes (Signed)
HPI  Billy Kane is a pleasant 80 year old male who is here today for followup visit after recent emergency room visit for syncope. He has known history of  coronary artery disease, history of myocardial infarction, s/p CABG in 1998 and chronic A. fib.  He has prolonged history of orthostatic hypotension. This seems to be worsening. Yesterday, he noted elevated heart rate at 116 which is unusual for him. He had a syncopal episode after he tried to stand up. He went to the emergency room at [facility]. ECG showed atrial flutter with heart rate of 58 beats per minute. Labs were remarkable for creatinine of 1.57 with a BUN of 20. He does have chronic kidney disease. Urinalysis was unremarkable. Cardiac enzymes were negative. He denies any chest discomfort.   Allergies  Allergen Reactions  . Aricept [Donepezil Hcl] Diarrhea     Current Outpatient Prescriptions on File Prior to Visit  Medication Sig Dispense Refill  . acetaminophen (TYLENOL) 500 MG tablet Take 500 mg by mouth 2 (two) times daily.        Marland Kitchen albuterol-ipratropium (COMBIVENT) 18-103 MCG/ACT inhaler Inhale 2 puffs into the lungs every 6 (six) hours as needed for wheezing or shortness of breath.      Marland Kitchen aspirin 81 MG EC tablet Take 81 mg by mouth daily.        Marland Kitchen atorvastatin (LIPITOR) 20 MG tablet TAKE 1 TABLET BY MOUTH DAILY.  90 tablet  1  . budesonide-formoterol (SYMBICORT) 80-4.5 MCG/ACT inhaler Inhale 2 puffs into the lungs 2 (two) times daily.  1 Inhaler  12  . Cholecalciferol (VITAMIN D-3) 1000 UNITS CAPS Take 1 capsule by mouth daily.      . diphenhydrAMINE (BENADRYL) 25 MG tablet Take 25 mg by mouth at bedtime.      Mariane Baumgarten Sodium (STOOL SOFTENER) 100 MG capsule Take 100 mg by mouth 2 (two) times daily as needed.        . enalapril (VASOTEC) 2.5 MG tablet Take 2.5 mg by mouth daily.      Marland Kitchen escitalopram (LEXAPRO) 20 MG tablet TAKE 1 TABLET BY MOUTH DAILY.  30 tablet  2  . fentaNYL (DURAGESIC - DOSED MCG/HR) 12 MCG/HR Place 1 patch  onto the skin every other day.       . finasteride (PROSCAR) 5 MG tablet Take 1 tablet (5 mg total) by mouth daily.  90 tablet  3  . guaiFENesin (MUCINEX) 600 MG 12 hr tablet Take 1,200 mg by mouth 2 (two) times daily.      Marland Kitchen KRILL OIL PO Take 1 tablet by mouth daily.      . Lenalidomide (REVLIMID) 5 MG CAPS Take 1 capsule by mouth as directed. 1 capsule for 21 days then 7 days off then repeat       . memantine (NAMENDA) 10 MG tablet Take 1 tablet (10 mg total) by mouth 2 (two) times daily.  180 tablet  3  . metoprolol tartrate (LOPRESSOR) 25 MG tablet TAKE 1/2 TABLET BY MOUTH 2 TIMES DAILY  90 tablet  1  . Multiple Vitamin (MULTIVITAMIN) tablet Take 1 tablet by mouth daily.      . polyethylene glycol powder (GLYCOLAX/MIRALAX) powder Take 17 g by mouth 2 (two) times daily as needed.  3350 g  1  . tamsulosin (FLOMAX) 0.4 MG CAPS capsule TAKE 1 CAPSULE EVERY DAY  30 capsule  5  . warfarin (COUMADIN) 5 MG tablet TAKE 1 TABLET AS DIRECTED  45 tablet  3  No current facility-administered medications on file prior to visit.     Past Medical History  Diagnosis Date  . Coronary artery disease   . Depression   . TIA (transient ischemic attack)   . CAD (coronary artery disease)     s/p AMI 1998  . Lymphocytic leukemia 12/10    small, diagnosed 12/10  . Multiple myeloma     diagnosed with monclonal spike fo 5 gm  . S/P coronary artery bypass graft x 4 1998    [facility], SVG to distal RCA, OM1 D1; LIMA to distal LAD  . Hypertension   . Hyperlipidemia   . Chronic kidney disease     Stage III, secondary to myeloma  . PUD (peptic ulcer disease)      Past Surgical History  Procedure Laterality Date  . Cholecystectomy    . Cholecystectomy  > 20 years ago  . Coronary artery bypass graft       Family History  Problem Relation Age of Onset  . Coronary artery disease Father   . Heart attack Father 79  . Coronary artery disease Brother   . Heart disease Mother     CAD   . Cancer Brother       History   Social History  . Marital Status: Widowed    Spouse Name: N/A    Number of Children: N/A  . Years of Education: N/A   Occupational History  . Not on file.   Social History Main Topics  . Smoking status: Never Smoker   . Smokeless tobacco: Never Used  . Alcohol Use: No  . Drug Use: No  . Sexual Activity: No   Other Topics Concern  . Not on file   Social History Narrative   ** Merged History Encounter **          PHYSICAL EXAM   BP 110/70  Ht 5' 5"  (1.651 m)  Wt 191 lb (86.637 kg)  BMI 31.78 kg/m2 Constitutional: He is oriented to person, place, and time. He appears well-developed and well-nourished. No distress.  HENT: No nasal discharge.  Head: Normocephalic and atraumatic.  Eyes: Pupils are equal and round.  No discharge. Neck: Normal range of motion. Neck supple. No JVD present. No thyromegaly present.  Cardiovascular: Normal rate, irregular rhythm, normal heart sounds. Exam reveals no gallop and no friction rub. No murmur heard.  Pulmonary/Chest: Effort normal and breath sounds normal. No stridor. No respiratory distress. He has no wheezes. He has no rales. He exhibits no tenderness.  Abdominal: Soft. Bowel sounds are normal. He exhibits no distension. There is no tenderness. There is no rebound and no guarding.  Musculoskeletal: Normal range of motion. He exhibits no edema and no tenderness.  Neurological: He is alert and oriented to person, place, and time. Coordination normal.  Skin: Skin is warm and dry. No rash noted. He is not diaphoretic. No erythema. No pallor.  Psychiatric: He has a normal mood and affect. His behavior is normal. Judgment and thought content normal.       EKG: Atrial flutter with variable AV block. Nonspecific ST changes.   ASSESSMENT AND PLAN

## 2014-01-23 NOTE — Patient Instructions (Addendum)
Your physician has recommended you make the following change in your medication:  Stop Enalapril  Take Metoprolol 1/2 tablet as needed for heart rate >100   Your physician wants you to follow-up in: 6 months with Dr. Fletcher Anon. You will receive a reminder letter in the mail two months in advance. If you don't receive a letter, please call our office to schedule the follow-up appointment.

## 2014-01-23 NOTE — Telephone Encounter (Signed)
Please call caregiver. Billy Kane went to the hospital (ER) and was sent home. Caregiver is worried about him. BP would be high the low, sob and he passed out.

## 2014-01-26 ENCOUNTER — Encounter: Payer: Self-pay | Admitting: Cardiovascular Disease

## 2014-01-26 DIAGNOSIS — R55 Syncope and collapse: Secondary | ICD-10-CM | POA: Insufficient documentation

## 2014-01-26 NOTE — Assessment & Plan Note (Addendum)
Continue rate control and long-term anticoagulation. I instructed them to use an extra dose of metoprolol if his heart rate is above 100.

## 2014-01-26 NOTE — Assessment & Plan Note (Signed)
He does have chronic exertional dyspnea but no chest discomfort. Continue medical therapy.

## 2014-01-26 NOTE — Assessment & Plan Note (Signed)
Based on the description, this was likely due to orthostatic hypotension in the setting of atrial fibrillation with rapid ventricular response. He is still orthostatic today. I advised him to increase fluid intake. We should allow his blood pressure to run Higher if possible. Thus, I discontinued enalapril. If this becomes a recurrent problem, thigh high support stockings can be considered.

## 2014-01-29 ENCOUNTER — Telehealth: Payer: Self-pay

## 2014-01-29 NOTE — Telephone Encounter (Signed)
Pt caregiver called and states pt passed out again. (12:15 pm) States pt is alert now and talking. States he doesn't remember it happening. Please call.

## 2014-01-29 NOTE — Telephone Encounter (Signed)
Informed patient that per verbal from Dr. Fletcher Anon  Continue to monitor patient  Change positions slowly  If patient take longer than usual to regain consciousness notify EMS  Inform us if episodes become more frequent or worsen  Patient and caregiver verbalized understanding

## 2014-01-29 NOTE — Telephone Encounter (Signed)
Caregiver called and stated patient passed out today  He was sitting down and went to stand up  He took 4 steps and "passed out"  His caregiver got him to the recliner and he did not fall  She stated his arms were twitching, his eyes were open and blank and he was non responsive  She stated this was the same as his previous episodes  She said his blood pressure has been good since his medication change  She said he seems totally back to normal

## 2014-01-31 ENCOUNTER — Ambulatory Visit (INDEPENDENT_AMBULATORY_CARE_PROVIDER_SITE_OTHER): Payer: Medicare Other

## 2014-01-31 ENCOUNTER — Telehealth: Payer: Self-pay | Admitting: *Deleted

## 2014-01-31 DIAGNOSIS — I4891 Unspecified atrial fibrillation: Secondary | ICD-10-CM

## 2014-01-31 DIAGNOSIS — Z5181 Encounter for therapeutic drug level monitoring: Secondary | ICD-10-CM

## 2014-01-31 DIAGNOSIS — Z7901 Long term (current) use of anticoagulants: Secondary | ICD-10-CM

## 2014-01-31 LAB — POCT INR: INR: 2.3

## 2014-01-31 MED ORDER — WARFARIN SODIUM 5 MG PO TABS: ORAL_TABLET | ORAL | Status: AC

## 2014-01-31 NOTE — Telephone Encounter (Signed)
[facility] [place] [facility]. Thanks, MI

## 2014-01-31 NOTE — Telephone Encounter (Signed)
Refill done as requested 

## 2014-02-06 LAB — COMPREHENSIVE METABOLIC PANEL
ALBUMIN: 2.9 g/dL — AB (ref 3.4–5.0)
Alkaline Phosphatase: 65 U/L
Anion Gap: 7 (ref 7–16)
BILIRUBIN TOTAL: 0.5 mg/dL (ref 0.2–1.0)
BUN: 14 mg/dL (ref 7–18)
CALCIUM: 9 mg/dL (ref 8.5–10.1)
CO2: 28 mmol/L (ref 21–32)
Chloride: 104 mmol/L (ref 98–107)
Creatinine: 1.48 mg/dL — ABNORMAL HIGH (ref 0.60–1.30)
EGFR (African American): 48 — ABNORMAL LOW
GFR CALC NON AF AMER: 41 — AB
GLUCOSE: 120 mg/dL — AB (ref 65–99)
Osmolality: 279 (ref 275–301)
Potassium: 3.3 mmol/L — ABNORMAL LOW (ref 3.5–5.1)
SGOT(AST): 16 U/L (ref 15–37)
SGPT (ALT): 19 U/L (ref 12–78)
Sodium: 139 mmol/L (ref 136–145)
TOTAL PROTEIN: 7.8 g/dL (ref 6.4–8.2)

## 2014-02-06 LAB — CBC CANCER CENTER
Basophil #: 0 x10 3/mm (ref 0.0–0.1)
Basophil %: 0.5 %
Eosinophil #: 0.4 x10 3/mm (ref 0.0–0.7)
Eosinophil %: 6.6 %
HCT: 35.2 % — ABNORMAL LOW (ref 40.0–52.0)
HGB: 11.7 g/dL — ABNORMAL LOW (ref 13.0–18.0)
LYMPHS ABS: 1.5 x10 3/mm (ref 1.0–3.6)
LYMPHS PCT: 22.9 %
MCH: 29 pg (ref 26.0–34.0)
MCHC: 33.2 g/dL (ref 32.0–36.0)
MCV: 87 fL (ref 80–100)
Monocyte #: 1.2 x10 3/mm — ABNORMAL HIGH (ref 0.2–1.0)
Monocyte %: 18.3 %
Neutrophil #: 3.5 x10 3/mm (ref 1.4–6.5)
Neutrophil %: 51.7 %
Platelet: 155 x10 3/mm (ref 150–440)
RBC: 4.03 10*6/uL — ABNORMAL LOW (ref 4.40–5.90)
RDW: 16.8 % — ABNORMAL HIGH (ref 11.5–14.5)
WBC: 6.7 x10 3/mm (ref 3.8–10.6)

## 2014-02-07 ENCOUNTER — Ambulatory Visit (INDEPENDENT_AMBULATORY_CARE_PROVIDER_SITE_OTHER): Payer: Medicare Other

## 2014-02-07 DIAGNOSIS — I4891 Unspecified atrial fibrillation: Secondary | ICD-10-CM

## 2014-02-07 DIAGNOSIS — Z7901 Long term (current) use of anticoagulants: Secondary | ICD-10-CM

## 2014-02-07 DIAGNOSIS — Z5181 Encounter for therapeutic drug level monitoring: Secondary | ICD-10-CM

## 2014-02-07 LAB — POCT INR: INR: 2.7

## 2014-02-08 ENCOUNTER — Telehealth: Payer: Self-pay | Admitting: Internal Medicine

## 2014-02-08 DIAGNOSIS — R9389 Abnormal findings on diagnostic imaging of other specified body structures: Secondary | ICD-10-CM

## 2014-02-08 LAB — KAPPA/LAMBDA FREE LIGHT CHAINS (ARMC)

## 2014-02-08 LAB — PROT IMMUNOELECTROPHORES(ARMC)

## 2014-02-08 NOTE — Telephone Encounter (Signed)
The chest xray done recently was concerning for a problem with his 5th rib .  Given his history of multiple myeloma and CKD. He needs a CT scan without contrast

## 2014-02-09 NOTE — Telephone Encounter (Signed)
Patient has had CT scan without contrast and has mass which patient is starting radiation for and has 5 scheduled radiation appointments,

## 2014-02-09 NOTE — Telephone Encounter (Signed)
Please tell Hoyle Sauer not to schedule then,  thanks

## 2014-02-13 ENCOUNTER — Telehealth: Payer: Self-pay

## 2014-02-13 ENCOUNTER — Ambulatory Visit: Payer: Medicare Other | Admitting: Internal Medicine

## 2014-02-13 NOTE — Telephone Encounter (Signed)
Pt sitter called and states that pt has started taking Chemo, and radiation, wants to know if we can let Dr. Jeb Levering know he needs a coumadin check, since he will already be there. Please call and advise.

## 2014-02-14 ENCOUNTER — Ambulatory Visit: Payer: Self-pay | Admitting: Oncology

## 2014-02-14 ENCOUNTER — Encounter: Payer: Self-pay | Admitting: Internal Medicine

## 2014-02-14 NOTE — Telephone Encounter (Signed)
Billy Kane from the [facility] left a message and stated they would be able to drawn the lab as long as we sent a prescription with the ICD-9 code included.  Will send Attn: haley to [phone].  Pt's daughter Billy Kane aware.

## 2014-02-18 ENCOUNTER — Telehealth: Payer: Self-pay | Admitting: Internal Medicine

## 2014-02-18 DIAGNOSIS — C9000 Multiple myeloma not having achieved remission: Secondary | ICD-10-CM

## 2014-02-20 LAB — COMPREHENSIVE METABOLIC PANEL
ALBUMIN: 2.7 g/dL — AB (ref 3.4–5.0)
Alkaline Phosphatase: 82 U/L
Anion Gap: 5 — ABNORMAL LOW (ref 7–16)
BILIRUBIN TOTAL: 0.3 mg/dL (ref 0.2–1.0)
BUN: 18 mg/dL (ref 7–18)
CREATININE: 1.43 mg/dL — AB (ref 0.60–1.30)
Calcium, Total: 8.8 mg/dL (ref 8.5–10.1)
Chloride: 104 mmol/L (ref 98–107)
Co2: 29 mmol/L (ref 21–32)
EGFR (African American): 50 — ABNORMAL LOW
GFR CALC NON AF AMER: 43 — AB
GLUCOSE: 141 mg/dL — AB (ref 65–99)
Osmolality: 280 (ref 275–301)
Potassium: 3.7 mmol/L (ref 3.5–5.1)
SGOT(AST): 25 U/L (ref 15–37)
SGPT (ALT): 22 U/L (ref 12–78)
Sodium: 138 mmol/L (ref 136–145)
Total Protein: 9.4 g/dL — ABNORMAL HIGH (ref 6.4–8.2)

## 2014-02-20 LAB — CBC CANCER CENTER
BASOS ABS: 0.1 x10 3/mm (ref 0.0–0.1)
Basophil %: 0.8 %
Eosinophil #: 0.2 x10 3/mm (ref 0.0–0.7)
Eosinophil %: 2.7 %
HCT: 35.7 % — AB (ref 40.0–52.0)
HGB: 11.6 g/dL — AB (ref 13.0–18.0)
LYMPHS PCT: 30.5 %
Lymphocyte #: 2 x10 3/mm (ref 1.0–3.6)
MCH: 28.9 pg (ref 26.0–34.0)
MCHC: 32.6 g/dL (ref 32.0–36.0)
MCV: 89 fL (ref 80–100)
MONOS PCT: 8.6 %
Monocyte #: 0.6 x10 3/mm (ref 0.2–1.0)
NEUTROS PCT: 57.4 %
Neutrophil #: 3.8 x10 3/mm (ref 1.4–6.5)
PLATELETS: 255 x10 3/mm (ref 150–440)
RBC: 4.03 10*6/uL — AB (ref 4.40–5.90)
RDW: 15.9 % — AB (ref 11.5–14.5)
WBC: 6.7 x10 3/mm (ref 3.8–10.6)

## 2014-02-20 LAB — PROTIME-INR
INR: 1.7
Prothrombin Time: 19.8 secs — ABNORMAL HIGH (ref 11.5–14.7)

## 2014-02-21 ENCOUNTER — Ambulatory Visit (INDEPENDENT_AMBULATORY_CARE_PROVIDER_SITE_OTHER): Payer: Medicare Other | Admitting: Cardiovascular Disease

## 2014-02-21 ENCOUNTER — Telehealth: Payer: Self-pay

## 2014-02-21 DIAGNOSIS — Z5181 Encounter for therapeutic drug level monitoring: Secondary | ICD-10-CM

## 2014-02-21 DIAGNOSIS — I4891 Unspecified atrial fibrillation: Secondary | ICD-10-CM

## 2014-02-21 LAB — PROTIME-INR
INR: 1.9
INR: 1.9 — AB (ref 0.9–1.1)
Prothrombin Time: 21.4 secs — ABNORMAL HIGH (ref 11.5–14.7)

## 2014-02-21 NOTE — Telephone Encounter (Signed)
Returned call to caregiver.  Result noted see anticoagulation note in Epic.

## 2014-02-21 NOTE — Telephone Encounter (Signed)
Pt caregiver called, states cancer [facility]  checked his INR today, it was 1.9, she asks when should he follow up to get it checked again.

## 2014-02-26 ENCOUNTER — Ambulatory Visit: Payer: Medicare Other | Admitting: Internal Medicine

## 2014-02-27 LAB — CBC CANCER CENTER
Basophil #: 0 x10 3/mm (ref 0.0–0.1)
Basophil %: 0.2 %
EOS ABS: 0.3 x10 3/mm (ref 0.0–0.7)
Eosinophil %: 3.8 %
HCT: 32.5 % — ABNORMAL LOW (ref 40.0–52.0)
HGB: 10.8 g/dL — ABNORMAL LOW (ref 13.0–18.0)
LYMPHS ABS: 1.2 x10 3/mm (ref 1.0–3.6)
Lymphocyte %: 18.4 %
MCH: 29.3 pg (ref 26.0–34.0)
MCHC: 33.1 g/dL (ref 32.0–36.0)
MCV: 88 fL (ref 80–100)
MONO ABS: 1.1 x10 3/mm — AB (ref 0.2–1.0)
Monocyte %: 15.8 %
Neutrophil #: 4.2 x10 3/mm (ref 1.4–6.5)
Neutrophil %: 61.8 %
Platelet: 141 x10 3/mm — ABNORMAL LOW (ref 150–440)
RBC: 3.68 10*6/uL — ABNORMAL LOW (ref 4.40–5.90)
RDW: 16.1 % — ABNORMAL HIGH (ref 11.5–14.5)
WBC: 6.7 x10 3/mm (ref 3.8–10.6)

## 2014-02-27 LAB — COMPREHENSIVE METABOLIC PANEL
ANION GAP: 8 (ref 7–16)
Albumin: 2.6 g/dL — ABNORMAL LOW (ref 3.4–5.0)
Alkaline Phosphatase: 84 U/L
BUN: 19 mg/dL — ABNORMAL HIGH (ref 7–18)
Bilirubin,Total: 0.5 mg/dL (ref 0.2–1.0)
CHLORIDE: 106 mmol/L (ref 98–107)
CO2: 25 mmol/L (ref 21–32)
CREATININE: 1.56 mg/dL — AB (ref 0.60–1.30)
Calcium, Total: 8.5 mg/dL (ref 8.5–10.1)
EGFR (African American): 45 — ABNORMAL LOW
EGFR (Non-African Amer.): 39 — ABNORMAL LOW
GLUCOSE: 109 mg/dL — AB (ref 65–99)
OSMOLALITY: 280 (ref 275–301)
POTASSIUM: 3.8 mmol/L (ref 3.5–5.1)
SGOT(AST): 18 U/L (ref 15–37)
SGPT (ALT): 21 U/L (ref 12–78)
Sodium: 139 mmol/L (ref 136–145)
Total Protein: 8.5 g/dL — ABNORMAL HIGH (ref 6.4–8.2)

## 2014-02-28 LAB — POCT INR: INR: 1.9 — AB (ref 0.9–1.1)

## 2014-02-28 LAB — BASIC METABOLIC PANEL
BUN: 19 mg/dL (ref 4–21)
Creatinine: 1.6 mg/dL — AB (ref 0.6–1.3)
Glucose: 115 mg/dL
Potassium: 3.8 mmol/L (ref 3.4–5.3)
Sodium: 141 mmol/L (ref 137–147)

## 2014-02-28 LAB — HEPATIC FUNCTION PANEL
ALK PHOS: 84 U/L (ref 25–125)
ALT: 21 U/L (ref 10–40)
AST: 18 U/L (ref 14–40)

## 2014-02-28 LAB — CBC AND DIFFERENTIAL
HEMATOCRIT: 32 % — AB (ref 41–53)
Hemoglobin: 10.8 g/dL — AB (ref 13.5–17.5)
PLATELETS: 141 10*3/uL — AB (ref 150–399)
WBC: 6.7 10^3/mL

## 2014-03-04 ENCOUNTER — Other Ambulatory Visit: Payer: Self-pay | Admitting: Internal Medicine

## 2014-03-04 DIAGNOSIS — C9000 Multiple myeloma not having achieved remission: Secondary | ICD-10-CM

## 2014-03-04 DIAGNOSIS — Z789 Other specified health status: Secondary | ICD-10-CM | POA: Insufficient documentation

## 2014-03-07 ENCOUNTER — Ambulatory Visit (INDEPENDENT_AMBULATORY_CARE_PROVIDER_SITE_OTHER): Payer: Medicare Other

## 2014-03-07 DIAGNOSIS — Z5181 Encounter for therapeutic drug level monitoring: Secondary | ICD-10-CM

## 2014-03-07 DIAGNOSIS — Z7901 Long term (current) use of anticoagulants: Secondary | ICD-10-CM

## 2014-03-07 DIAGNOSIS — I4891 Unspecified atrial fibrillation: Secondary | ICD-10-CM

## 2014-03-07 LAB — COMPREHENSIVE METABOLIC PANEL
ALBUMIN: 2.6 g/dL — AB (ref 3.4–5.0)
ALT: 20 U/L
Alkaline Phosphatase: 79 U/L
Anion Gap: 4 — ABNORMAL LOW (ref 7–16)
BILIRUBIN TOTAL: 0.5 mg/dL (ref 0.2–1.0)
BUN: 14 mg/dL (ref 7–18)
CALCIUM: 8.6 mg/dL (ref 8.5–10.1)
CREATININE: 1.46 mg/dL — AB (ref 0.60–1.30)
Chloride: 106 mmol/L (ref 98–107)
Co2: 27 mmol/L (ref 21–32)
EGFR (African American): 49 — ABNORMAL LOW
GFR CALC NON AF AMER: 42 — AB
GLUCOSE: 98 mg/dL (ref 65–99)
Osmolality: 274 (ref 275–301)
POTASSIUM: 4.2 mmol/L (ref 3.5–5.1)
SGOT(AST): 18 U/L (ref 15–37)
Sodium: 137 mmol/L (ref 136–145)
TOTAL PROTEIN: 8 g/dL (ref 6.4–8.2)

## 2014-03-07 LAB — CBC CANCER CENTER
BASOS ABS: 0 x10 3/mm (ref 0.0–0.1)
BASOS PCT: 0.2 %
EOS ABS: 0.2 x10 3/mm (ref 0.0–0.7)
EOS PCT: 3.7 %
HCT: 31.9 % — ABNORMAL LOW (ref 40.0–52.0)
HGB: 10.1 g/dL — ABNORMAL LOW (ref 13.0–18.0)
Lymphocyte #: 0.8 x10 3/mm — ABNORMAL LOW (ref 1.0–3.6)
Lymphocyte %: 11.5 %
MCH: 28.6 pg (ref 26.0–34.0)
MCHC: 31.8 g/dL — AB (ref 32.0–36.0)
MCV: 90 fL (ref 80–100)
MONOS PCT: 18.1 %
Monocyte #: 1.2 x10 3/mm — ABNORMAL HIGH (ref 0.2–1.0)
NEUTROS ABS: 4.4 x10 3/mm (ref 1.4–6.5)
Neutrophil %: 66.5 %
PLATELETS: 127 x10 3/mm — AB (ref 150–440)
RBC: 3.54 10*6/uL — ABNORMAL LOW (ref 4.40–5.90)
RDW: 17.2 % — ABNORMAL HIGH (ref 11.5–14.5)
WBC: 6.6 x10 3/mm (ref 3.8–10.6)

## 2014-03-07 LAB — POCT INR: INR: 2.4

## 2014-03-17 ENCOUNTER — Ambulatory Visit: Payer: Self-pay | Admitting: Oncology

## 2014-03-21 LAB — CBC CANCER CENTER
BASOS ABS: 0 x10 3/mm (ref 0.0–0.1)
Basophil %: 0.4 %
EOS ABS: 0.2 x10 3/mm (ref 0.0–0.7)
EOS PCT: 3.1 %
HCT: 29.1 % — AB (ref 40.0–52.0)
HGB: 9.3 g/dL — AB (ref 13.0–18.0)
LYMPHS ABS: 1.9 x10 3/mm (ref 1.0–3.6)
Lymphocyte %: 25.1 %
MCH: 29.4 pg (ref 26.0–34.0)
MCHC: 32 g/dL (ref 32.0–36.0)
MCV: 92 fL (ref 80–100)
MONOS PCT: 18 %
Monocyte #: 1.4 x10 3/mm — ABNORMAL HIGH (ref 0.2–1.0)
NEUTROS ABS: 4.1 x10 3/mm (ref 1.4–6.5)
NEUTROS PCT: 53.4 %
Platelet: 164 x10 3/mm (ref 150–440)
RBC: 3.17 10*6/uL — AB (ref 4.40–5.90)
RDW: 19.8 % — AB (ref 11.5–14.5)
WBC: 7.7 x10 3/mm (ref 3.8–10.6)

## 2014-03-21 LAB — COMPREHENSIVE METABOLIC PANEL
ANION GAP: 7 (ref 7–16)
Albumin: 2.7 g/dL — ABNORMAL LOW (ref 3.4–5.0)
Alkaline Phosphatase: 92 U/L
BUN: 21 mg/dL — AB (ref 7–18)
Bilirubin,Total: 0.7 mg/dL (ref 0.2–1.0)
Calcium, Total: 8.7 mg/dL (ref 8.5–10.1)
Chloride: 104 mmol/L (ref 98–107)
Co2: 25 mmol/L (ref 21–32)
Creatinine: 1.43 mg/dL — ABNORMAL HIGH (ref 0.60–1.30)
EGFR (Non-African Amer.): 43 — ABNORMAL LOW
GFR CALC AF AMER: 50 — AB
GLUCOSE: 105 mg/dL — AB (ref 65–99)
OSMOLALITY: 275 (ref 275–301)
Potassium: 3.8 mmol/L (ref 3.5–5.1)
SGOT(AST): 59 U/L — ABNORMAL HIGH (ref 15–37)
SGPT (ALT): 49 U/L
SODIUM: 136 mmol/L (ref 136–145)
TOTAL PROTEIN: 9.5 g/dL — AB (ref 6.4–8.2)

## 2014-03-28 ENCOUNTER — Telehealth: Payer: Self-pay

## 2014-03-28 NOTE — Telephone Encounter (Signed)
Nurse from hospice called, and states pt is not strong enough to come into the office to get PT/INR checked, would like an order to check in the home. Venous or finger stick.

## 2014-03-28 NOTE — Telephone Encounter (Signed)
That is fine 

## 2014-03-30 ENCOUNTER — Other Ambulatory Visit: Payer: Self-pay | Admitting: Internal Medicine

## 2014-03-30 ENCOUNTER — Ambulatory Visit (INDEPENDENT_AMBULATORY_CARE_PROVIDER_SITE_OTHER): Payer: Medicare Other | Admitting: Cardiology

## 2014-03-30 ENCOUNTER — Telehealth: Payer: Self-pay | Admitting: *Deleted

## 2014-03-30 DIAGNOSIS — Z5181 Encounter for therapeutic drug level monitoring: Secondary | ICD-10-CM

## 2014-03-30 LAB — POCT INR: INR: 3.5

## 2014-03-30 NOTE — Telephone Encounter (Signed)
INR addressed.  Please see anti-coag note for details.

## 2014-03-30 NOTE — Telephone Encounter (Signed)
Hospice nurse called in INR 3.5

## 2014-04-01 ENCOUNTER — Other Ambulatory Visit: Payer: Self-pay | Admitting: Internal Medicine

## 2014-04-10 ENCOUNTER — Telehealth: Payer: Self-pay | Admitting: Internal Medicine

## 2014-04-10 NOTE — Telephone Encounter (Signed)
Pt caregiver called to stated the patient passed aware this morning.

## 2014-04-10 NOTE — Telephone Encounter (Signed)
FYI

## 2014-04-10 NOTE — Telephone Encounter (Signed)
please ask Larene Beach to give me a sympathy card to send to son

## 2014-04-12 ENCOUNTER — Ambulatory Visit: Payer: Self-pay | Admitting: Cardiovascular Disease

## 2014-04-12 DIAGNOSIS — Z5181 Encounter for therapeutic drug level monitoring: Secondary | ICD-10-CM

## 2014-04-12 DIAGNOSIS — I4891 Unspecified atrial fibrillation: Secondary | ICD-10-CM

## 2014-04-16 NOTE — Telephone Encounter (Signed)
Dr. Derrel Nip has been given a sympathy card

## 2014-04-17 ENCOUNTER — Ambulatory Visit: Payer: Self-pay | Admitting: Oncology

## 2014-04-17 DEATH — deceased

## 2014-05-08 ENCOUNTER — Ambulatory Visit: Payer: Medicare Other | Admitting: Internal Medicine

## 2014-08-15 ENCOUNTER — Encounter: Payer: Self-pay | Admitting: *Deleted

## 2014-08-29 NOTE — Telephone Encounter (Signed)
Mailed unread message to pt  

## 2014-12-08 NOTE — Consult Note (Signed)
PATIENT NAME:  Billy Kane, Billy Kane MR#:  [id number] DATE OF BIRTH:  [birth year]  DATE OF CONSULTATION:  11/11/2013  REFERRING PHYSICIAN:   CONSULTING PHYSICIAN:  Deboraha Sprang, MD  HISTORY OF PRESENT ILLNESS:  Thank you very much for asking [place] to see Billy Kane in consultation because of borderline positive troponin.    Billy Kane is an elderly gentleman of 52 with a history of coronary artery disease, prior bypass surgery in 1998, remote myocardial infarction.  He also has a history of paroxysmal atrial fibrillation and has had a number of Myoview scans in the last couple of years.  The results of which I have not been able to find, although the images that I looked at from 2012 demonstrate a little bit of inferior ischemia, but not a significant problem to my untrained eye.  He was seen by his primary cardiologist just a couple of days ago at which point he was feeling pretty well.  He has, however, developed a recurrent cough and this has been a problem over the years, which frequently ends him in the hospital.  His primary care physician prescribed him a Z-Pak, but the cough did not improve so he came to the Emergency Room.  He is noted to have a low-grade fever.  His chest x-ray was clear.  There is minimal leukocytosis with a white count of 11,000 and the patient was admitted.  Serial enzymes demonstrated a troponin of 0.2 and cardiology was consulted.   PAST MEDICAL HISTORY:  Also, notable for myeloma.  The extent of which is not known, although he is followed by Dr. Oliva Bustard for this.  He has dementia, which is stable.  Further includes hypertension, hyperlipidemia, BPH, gout.   PAST SURGICAL HISTORY:  Notable for bypass surgery and cholecystectomy.   SOCIAL HISTORY:  He is widowed.  He has three children.  He was a former Engineer, site.   HOME MEDICATIONS:  Aspirin, finasteride, Duragesic, enalapril, tamsulosin, warfarin, Lexapro, atorvastatin, Benadryl, metoprolol, Mucinex, Azithromycin, Namenda,  methocarbamol, and vitamin D.   ALLERGIES:  There are no known drug allergies.   REVIEW OF SYSTEMS:  Is not directly obtainable and from the chart is broadly negative apart from what is listed previously.   PHYSICAL EXAMINATION: GENERAL:  He is febrile at 99.7.  His blood pressure is 132/68.  His pulse was 85 and regular.  His respirations were 18 and unlabored.  HEENT:  Normal.  NECK:  Her neck veins were flat.  Carotids were brisk and full.  BACK:  Without kyphosis, scoliosis.  There was some rhonchi.  HEART:  Heart sounds are regular without murmurs or gallops.  ABDOMEN:  Soft with active bowel sounds.  EXTREMITIES:  Without edema.  SKIN:  Warm, but he was quite diaphoretic.  NEUROLOGIC:  He was able to move all four extremities.  He was alert, but not oriented.    LABORATORY DATA:  There is no ECG to review.  Troponins as noted have been in the 0.2 to 0.28 range.   IMPRESSION: 1.  Acute bronchitis.   2.  Positive troponins, almost certainly type II.    PLAN:  He has been given aspirin.  I think this is probably reasonable for a month or so, although probably not necessary in the context of his anticoagulation with warfarin.  Hence, I would stop it.  He remains quite ill with low-grade fever and diaphoresis and suspect he will need a couple of days prior to be ready for discharge.  An outpatient Myoview had been scheduled.  My discussions with the son would suggest that this is probably reasonable to cancel.  I will take care of that.   We will follow with you.  Thank you for the consultation.    ____________________________ Deboraha Sprang, MD sck:ea D: 11/11/2013 13:05:49 ET T: 11/11/2013 16:25:19 ET JOB#: [id number]  cc: Deboraha Sprang, MD, <Dictator> Deboraha Sprang MD ELECTRONICALLY SIGNED 11/28/2013 13:04

## 2014-12-08 NOTE — Discharge Summary (Signed)
PATIENT NAME:  Billy Kane, Billy Kane MR#:  [id number] DATE OF BIRTH:  [birth year]  DATE OF ADMISSION:  11/11/2013 DATE OF DISCHARGE:  11/12/2013  ADMITTING DIAGNOSES: Cough, shortness of breath, elevated cardiac enzymes.   DISCHARGE DIAGNOSES: 1. Cough due to likely acute bronchitis with a possible acute bronchospasm, now the symptoms improved.  2. Elevated troponin in an 81 year old with history of previous CABG, felt to have likely demand ischemia, but also could have blockage of his bypass grafts. Family is not interested in CABG or any intervention, seen by cardiology. Recommended to medically manage the patient.  3. Chronic kidney disease stage 3 at baseline.  4. Multiple myeloma on Revlimid therapy.  5. Hypertension.  6. Hyperlipidemia.  7. Benign prostatic hypertrophy.  8. Coronary artery disease.  9. Paroxysmal atrial fibrillation is on chronic anticoagulation.  10. Depression.  11. Dementia.  12. Status post CABG.  13. Status post cholecystectomy.   PERTINENT LABS AND EVALUATIONS: Admitting glucose 134. BNP 1585, BUN 15, creatinine 1.48, sodium 137, potassium 3.7, chloride 105, CO2 is 26. Troponin 0.2. WBC 11, hemoglobin 12.8. Platelet count is 120. INR 2. EKG shows normal sinus rhythm with first-degree AV block.   Chest x-ray. No evidence of acute cardiopulmonary processes.   LABORATORY DATA ON DISCHARGE: Hemoglobin 10.7, 11.6, platelet count was 107.   HOSPITAL COURSE: Please refer to H and P done by the admitting physician. The patient is an 81 year old white male with the above stated medical problems presented with cough, shortness of breath. Also, noted to have wheezing, and was seen in the ED. He had an elevated troponin. EKG is without any acute abnormality. He was admitted for further evaluation and treatment. The patient was treated with IV antibiotics. Nebulizers due to wheezing, and also treated with steroids. With these treatments, his symptoms improved significantly. The  patient was ambulated and did not have any difficulty with breathing. At this time, he is doing much better from a respiratory standpoint and is stable for discharge. The patient also was noted to have a borderline elevation troponin. He was seen by cardiology. They recommended monitoring him without any intervention. They discussed the case with the family and the patient, at his age, they were not interested in any aggressive intervention. The patient did have outpatient stress test, which was cancelled by the cardiologist.   DISCHARGE MEDICATIONS: Atorvastatin 20 daily, Lexapro 20 daily, metoprolol 25 - 1/2 tabs p.o. b.i.d., finasteride 5 mg daily, Flomax 0.4 daily, Ecotrin 81 mg 1 tab p.o. daily, Tylenol extra strength 500 mg 2 tabs b.i.d., Benadryl 25 at bedtime, Mucinex 600 mg 1 tab p.o. q.12, Coumadin 5 mg alternating with 7.5 mg, 5 mg on Monday, Wednesday and Friday, the rest 7.5, enalapril 2.5 daily, methyl carbinol 500 mg half to 1 tab p.o. b.i.d., Symbicort  2 puffs daily, vitamin D3 1000 international units daily. Revlimid 5 mg as taking previously, Duragesic 12 mcg daily, Namenda 10 1 tabs p.o. b.i.d., prednisone taper starting at 60, taper by 10 until complete, Levaquin 500 q.24 hours x 5 days. Combivent 1 puff every four hours for five days then p.r.n. q. 6.   HOME OXYGEN:  No  DIET: Low-sodium, consistency regular.   ACTIVITY: As tolerated.   FOLLOWUP: With primary M.D. in 1 to 2 weeks.    TIME SPENT: 35 minutes.    ____________________________ Lafonda Mosses Posey Pronto, MD shp:sg D: 11/13/2013 08:35:35 ET T: 11/13/2013 09:33:06 ET JOB#: [id number]  cc: Keyion Knack H. Posey Pronto, MD, <Dictator> Alric Seton MD  ELECTRONICALLY SIGNED 11/24/2013 8:47

## 2014-12-08 NOTE — Consult Note (Signed)
Brief Consult Note: Diagnosis: known CAD, s/p CABG, PAF hx TIA  and Multiple Myeloma Rx as outpt with [facility] for ?bronchitis, admitted for cough and low grade fever,,now with borderline troponin.   Consult note dictated.   Orders entered.   Comments: 1) +Tn prob type 2 NSTEMI from demand 2) CAD with prior CABG-- outpt myoview had been ordered by Dr Lesly Dukes  family not interested in pursuing so I will cancel 3) given mechanism ascribed to +Tn would stop ASA 4) continue med Rx for bronchitis.  Electronic Signatures: Deboraha Sprang (MD)  (Signed 28-Mar-15 13:06)  Authored: Brief Consult Note   Last Updated: 28-Mar-15 13:06 by Deboraha Sprang (MD)

## 2014-12-08 NOTE — H&P (Signed)
PATIENT NAME:  Billy Kane, Billy Kane MR#:  [id number] DATE OF BIRTH:  [birth year]  DATE OF ADMISSION:  11/11/2013  REFERRING PHYSICIAN:  Dr. Elyn Peers.   PRIMARY CARE PHYSICIAN:  Dr. Deborra Medina.   PRIMARY CARDIOLOGIST:  [facility] in [place].   PRIMARY ONCOLOGIST:  Dr. Oliva Bustard.   CHIEF COMPLAINT:  Cough.   HISTORY OF PRESENT ILLNESS:  This is an 81 year old male with known history of multiple myeloma, coronary artery disease, dementia, hypertension, chronic kidney disease stage 3, presents with complaints of cough.  Family at bedside.  They give most of the history as the patient has dementia, they report the patient has been complaining of cough for the last 24 hours, where his PCP prescribed him Z-Pak where he took first dose yesterday afternoon, reports his cough did not improve which prompted them to bring him to the ED, in the ED the patient had low grade temp at 99.5, the patient's chest x-ray did not show any opacity or infiltrate, the patient did not have any leukocytosis, the patient's blood work did show baseline chronic kidney disease where creatinine has been at his baseline around 1.48, as well he had mild leukocytosis at 11,000, as well the patient had blood work was significant for elevated troponin at 0.2, the patient and family denies the patient having any chest pain, he does not have any EKG changes, he is on a daily baby aspirin which he took his dose yesterday as well, hospitalist service requested to admit the patient for further evaluation of his troponins, as well the patient is on warfarin for A. Fib, with his INR has been therapeutic at 2, the patient's EKG showed a normal sinus rhythm at 96 beats per minute.  The patient is demented, very poor historian.  His review of systems and history is not reliable, so have to rely on daughter at bedside to give the history.  PAST MEDICAL HISTORY: 1.  Multiple myeloma, followed by Dr. Oliva Bustard.  2.  Hypertension.  3.   Hyperlipidemia.  4.  BPH.  5.  Gout.  6.  Coronary artery disease, status post CABG.  7.  Paroxysmal atrial fibrillation, on warfarin.  8.  Dementia.  9.  Depression.  10.  Chronic kidney disease stage 3.   PAST SURGICAL HISTORY: 1.  CABG.  2.  Cholecystectomy.   ALLERGIES:  No known drug allergies.   SOCIAL HISTORY:  The patient non-smoking.  No alcohol.  No drug use.    FAMILY HISTORY:  Significant for heart disease.   HOME MEDICATIONS: 1.  Aspirin 81 mg oral daily.  2.  Finasteride 5 mg oral daily.  3.  Duragesic patch every four hours once.  4.  Tylenol as needed.  5.  Enalapril 2.5 mg oral daily.  6.  Tamsulosin 0.4 mg oral daily.  7.  Warfarin 7.5 mg Tuesday, Thursday, Saturday and Sunday.  8.  Lexapro 20 mg daily.  9.  Benadryl 25 mg at bedtime.  10.  Atorvastatin 20 mg oral at bedtime.  11.  Metoprolol 25 mg oral 2 times a day.  12.  Mucinex 600 mg oral every 12 hours.  13.  Azithromycin Z-Pak started yesterday.  14.  Revlimid 5 mg oral daily.  Take for 21 days, then off for 7 days.  15.  Namenda 10 mg oral 2 times a day.  16.  Methocarbamol 500 mg half tablet to 1 tablet 2 times a day.  17.  Vitamin D3 1000 International units oral daily.  18.  Warfarin 5 mg every Monday, Wednesday and Friday.   REVIEW OF SYSTEMS: CONSTITUTIONAL:  The patient has dementia.  His review of systems is not very reliable so was obtained through the assistance of daughter, they report fever, chills.  They deny weight gain, weight loss or weakness.  EYES:  No blurry vision, double vision or inflammation.  EARS, NOSE, THROAT:  Denies tinnitus, ear pain, hearing loss, epistaxis or discharge.  RESPIRATORY:  Reports cough.  No hemoptysis.  No shortness of breath.  No COPD.  CARDIOVASCULAR:  Denies chest pain, edema, palpitations, syncope.  GASTROINTESTINAL:  No nausea, vomiting, diarrhea, abdominal pain, hematemesis.  GENITOURINARY:  No dysuria, hematuria, renal colic.  ENDOCRINE:  No  polyuria, polydipsia, heat or cold intolerance.  HEMATOLOGIC:  No anemia.  No easy bruising,  INTEGUMENTARY:  No acne or rash or skin lesion.  MUSCULOSKELETAL:  No swelling.  No gout.  No cramps.  NEUROLOGIC:  No CVA.  No TIA.  No focal deficits.  No vertigo.  Has history of dementia.  PSYCHIATRIC:  No anxiety, insomnia or depression.   PHYSICAL EXAMINATION: VITAL SIGNS:  Temperature 99.4, pulse 72, respiratory rate 18, blood pressure 93/50, saturating 96% on oxygen.  GENERAL:  Elderly male who looks comfortable in bed in no apparent distress.  HEENT:  Head atraumatic, normocephalic.  Pupils equal, reactive to light.  Pink conjunctivae.  Anicteric sclerae. Dry oral mucosa.  NECK:  Supple.  No thyromegaly.  No JVD.  CHEST:  Good air entry bilaterally.  No wheezing, rales, rhonchi.  CARDIOVASCULAR:  S1, S2 heard.  No rubs, murmurs, or gallops.  Regular.  ABDOMEN:  Soft, nontender, nondistended.  Bowel sounds present.  EXTREMITIES:  No edema.  No clubbing.  No cyanosis.  Pedal and radial pulses +2 bilaterally.  PSYCHIATRIC:  The patient is awake, alert, oriented x 2, mildly confused. NEUROLOGIC:  Cranial nerves grossly intact.  Motor five out of five.  No focal deficits.  SKIN:  Warm and dry, delayed skin turgor.  MUSCULOSKELETAL:  No joint effusion or erythema could be appreciated.   PERTINENT LABORATORY DATA:  Glucose 134.  BNP 1585.  BUN 15, creatinine 1.48, sodium 137, potassium 3.7, chloride 105, CO2 26.  Troponin 0.2.  White blood cells 11, hemoglobin 12.8, hematocrit 39.1, platelets 120.  INR is 2.  EKG showing normal sinus rhythm with first degree AV block at 96 beats per minute.  Chest x-ray, no evidence of acute cardiopulmonary disease.   ASSESSMENT AND PLAN: 1.  Elevated troponins, the patient denies any chest pain, has no EKG changes, this is most likely demand ischemia from his chronic kidney disease and acute bronchitis.  The patient will be admitted to telemetry unit for further  evaluation.  We will continue to cycle his cardiac enzymes, follow the trend.  We will consult [facility] for further recommendation.  We will give him 324 mg of aspirin in the ED, he is already on beta blockers, ACE inhibitors, statin and he is already on anticoagulation with warfarin with therapeutic INR.  2.  Acute bronchitis, has no acute finding on chest x-ray.  We will continue with Rocephin and azithromycin.  3.  Chronic kidney disease, appears to be at baseline.  We will monitor.  We will avoid nephrotoxic medication.  4.  Multiple myeloma.  We will continue with Revlimid.  5.  Hypertension.  We will continue with home medication.  Blood pressure acceptable, actually on the lower side.  We will hydrate.  6.  Hyperlipidemia.  Continue with statin.  7.  Benign prostatic hypertrophy.  Continue with tamsulosin and finasteride.  8.  Coronary artery disease.  The patient is on aspirin, statin, beta blockers and ACE inhibitor.  9.  Paroxysmal atrial fibrillation, currently normal sinus rhythm, rate controlled with therapeutic INR on warfarin.  10.  Depression.  Continue with Lexapro.  11.  Dementia.  Continue with Namenda.  12.  Deep vein thrombosis prophylaxis.  The patient is on full dose anticoagulation.  13.  CODE STATUS:  DISCUSSED WITH THE PATIENT AND DAUGHTER, THE PATIENT IS CURRENTLY FULL CODE.   Total time spent on admission and patient care 55 minutes.     ____________________________ Albertine Patricia, MD dse:ea D: 11/11/2013 01:31:59 ET T: 11/11/2013 02:24:22 ET JOB#: [id number]  cc: Albertine Patricia, MD, <Dictator> Copelyn Widmer Graciela Husbands MD ELECTRONICALLY SIGNED 11/13/2013 0:00

## 2014-12-08 NOTE — Consult Note (Signed)
Reason for Visit: This 81 year old Male patient presents to the clinic for initial evaluation of  multiple myeloma .   Referred by Dr. Oliva Bustard.  Diagnosis:  Chief Complaint/Diagnosis   81 year old male with multiple myeloma increasing pain and right lateral ribs with plain film evidence of rib involvement for palliative radiation therapy  Pathology Report pathology report reviewed   Imaging Report CT scans plain films reviewed   Referral Report clinical notes reviewed   Planned Treatment Regimen palliative radiation therapy to right lateral ribs   HPI   patient is a pleasant 81 year old male with multiple myeloma as well as small lymphocytic leukemia. Has a cytogenic abnormality at 13 Q. deletion. He has also monoclonal spike. Other comorbidities include atrial fibrillation on chronic Coumadin therapy. Currently on Revlimid. Has been having increasing right lateral rib pain. CT scan demonstrated multiple pole osseous findings compatible with multiple myeloma including a posterior fifth rib lesion with cortical destruction and pathologic fractureas well seventh posterior rib fracture which is favored to be pathologic.I've asked to evaluate patient for possible palliative radiation therapy to those ribs. Patient is ambulating well has no other areas of significant pain at [facility] time.  Past Hx:    dementia:    anemia:    arthritis:    renal insufficiency:    HTN:    Multi-drug Resistant Organism (MDRO): Positive culture for MRSA., 03-Jul-2010   pneumonia:    multiple myeloma:    CABG: 1997   cholecystectomy:   Past, Family and Social History:  Past Medical History positive   Cardiovascular CABG performed; hypertension   Respiratory pneumonia   Genitourinary rrenal insufficiency   Neurological/Psychiatric Alzheimer's   Past Surgical History cholecystectomy   Past Medical History Comments anemia, arthritis, history of MRSA   Family History noncontributory   Social  History noncontributory   Additional Past Medical and Surgical History seen accompanied by multiple family members today   Allergies:   No Known Allergies:   Home Meds:  Home Medications: Medication Instructions Status  oxyCODONE 5 mg oral tablet 1 tab(s) orally  - for Pain every 4-6 hours prn Active  Duragesic-12 transdermal film, extended release 1 patch transdermal every 48 hours Active  Combivent Respimat CFC free 100 mcg-20 mcg/inh inhalation aerosol 1 puff(s) inhaled 4 times a day x 5 days then prn q6 as needed Active  Revlimid 5 mg oral capsule 1 cap(s) orally once a day -Take for 21 days then off 7 days)   auth number [id number] adult male  Active  Symbicort 160 mcg-4.5 mcg/inh inhalation aerosol 2 puff(s) inhaled once a day Active  methocarbamol 500 mg oral tablet 0.5 to 1 tab(s) orally 2 times a day Active  atorvastatin 20 mg tablet 1 tab(s) orally once a day (at bedtime)  Active  Lexapro 20 mg tablet 1 tab(s) orally once a day  Active  metoprolol 25 mg tablet 0.5 tab(s) orally 2 times a day  Active  finasteride 5 mg tablet 1 tab(s) orally once a day (in the morning) Active  tamsulosin 0.4 mg capsule 1 cap(s) orally once a day (in the morning) Active  Ecotrin Adult Low Strength 81 mg enteric coated tablet 1 tab(s) orally once a day (in the morning) Active  Tylenol Extra Strength 500 mg tablet 2 tab(s) orally 2 times a day Active  Benadryl 25 mg oral capsule 1 cap(s) orally once a day (at bedtime) Active  Mucinex 600 mg oral tablet, extended release 1 tab(s) orally every 12 hours Active  Vitamin D3 1000 intl units oral capsule 1 cap(s) orally once a day Active  Namenda 10 mg oral tablet 1 tab(s) orally 2 times a day Active  Coumadin 71m 1 tab(s) orally once a day Active   Review of Systems:  General negative   Performance Status (ECOG) 0   Skin negative   Breast negative   Ophthalmologic negative   ENMT negative   Respiratory and Thorax negative   Cardiovascular  negative   Gastrointestinal negative   Genitourinary negative   Musculoskeletal negative   Neurological negative   Psychiatric negative   Hematology/Lymphatics negative   Endocrine negative   Allergic/Immunologic negative   Review of Systems   review of systems obtained from nurses notes  Physical Exam:  General/Skin/HEENT:  General normal   Skin normal   Eyes normal   ENMT normal   Head and Neck normal   Additional PE well-developed elderly male in NAD. Lungs are clear to A&P cardiac examination shows regular rate and rhythm. Abdomen is benign. There is some pain elicited on palpation of his right lateral ribs. No pain is elicited on deep palpation of his spine. Abdomen is benign. Motor sensory and DTR levels are equal and symmetric in the upper and lower extremities.   Breasts/Resp/CV/GI/GU:  Respiratory and Thorax normal   Cardiovascular normal   Gastrointestinal normal   Genitourinary normal   MS/Neuro/Psych/Lymph:  Musculoskeletal normal   Neurological normal   Lymphatics normal   Other Results:  Radiology Results: LabUnknown:    24-Jun-15 11:46, CT Chest Without Contrast  PACS Image   CT:  CT Chest Without Contrast   REASON FOR EXAM:    soft tissue mass multiple myeloma R rib cage pain  COMMENTS:       PROCEDURE: CT  - CT CHEST WITHOUT CONTRAST  - Feb 07 2014 11:46AM     CLINICAL DATA:  Right rib pain. Soft tissue mass/multiple myeloma.  Lymphocytic leukemia.    EXAM:  CT CHEST WITHOUT CONTRAST    TECHNIQUE:  Multidetector CT imaging of the chest was performed following the  standard protocol without IV contrast..  COMPARISON:  Chest radiograph 01/16/2014. 12/30/2011 bone survey. No  prior chest CT.    FINDINGS:  Lungs/Pleura: Subpleural 3 mm right upper lobe lung nodule or  nodules x2 on image 31.    Posterior right upper lobe calcified granuloma on image 16.    Scarring at the left lung base. Minimal motion  degradation  inferiorly.    Trace right-sided pleural fluid.    Heart/Mediastinum: Aortic and branch vessel atherosclerosis. Mild  cardiomegaly. Prior median sternotomy. No pericardial effusion.  Pulmonary artery enlargement, with the outflow tract measuring 3.1  cm and the right pulmonary arterymeasuring 3.4 cm.    Multiple small middle and anterior mediastinal nodes. None are  pathologic by size criteria. Hilar regions poorly evaluated without  intravenous contrast.    Upper Abdomen: Left hepatic lobe cysts. Normal imaged portions of  the spleen, stomach, adrenal glands, kidneys. Fatty atrophy  throughout the pancreas.    Bones/Musculoskeletal: Innumerable lytic lesions throughout the  bones, consistent with the clinical history of multiple myeloma. An  expansile posterior right fifth rib lesion with cortical  disruption/pathologic fracture. 2.2 cm on image 16. A seventh  posterior lateral right rib fracture is likely pathologic and  minimally displaced. Image 28. No significant healing. Diffuse  idiopathic skeletal hyperostosis involving the thoracic spine.     IMPRESSION:  1. Osseous findings of multiple myeloma, including a  dominant right  posterior fifth rib lesion with cortical destruction/pathologic  fracture. A seventh posterior right rib fracture which is also  favored to be pathologic. This could be acute or subacute, given  absence of significant healing.  2. Cardiomegaly. Pulmonary artery enlargement suggests pulmonary  arterial hypertension.  3. Trace right pleural fluid.  4. No thoracic adenopathy.  Electronically Signed    By: Abigail Miyamoto M.D.    On: 02/07/2014 14:26         Verified By: Areta Haber, M.D.,   Relevent Results:   Relevant Scans and Labs CT scan reviewed   Assessment and Plan: Impression:   right lateral rib involvement of multiple myeloma with pathologic fracture for palliative radiation Plan:   at this summer to get a quick  course of radiation therapy for palliation to his right lateral ribs. We'll plan on delivering 2000 cGy in 5 fractions. I have ordered CT simulation for next week. Risks and benefits of treatment including possible skin reaction, inclusion of some slight superficial lung, fatigue, possible alteration of blood counts, old are explained in detail to the patient and his family. I have set up CT simulation early next week. I discussed the case personally with medical oncology.  I would like to take this opportunity for allowing me to participate in the care of your patient..  CC Referral:  cc: Dr. Deborra Medina   Electronic Signatures: Baruch Gouty Roda Shutters (MD)  (Signed 25-Jun-15 11:18)  Authored: HPI, Diagnosis, Past Hx, PFSH, Allergies, Home Meds, ROS, Physical Exam, Other Results, Relevent Results, Encounter Assessment and Plan, CC Referring Physician   Last Updated: 25-Jun-15 11:18 by Armstead Peaks (MD)

## 2016-01-27 IMAGING — CR DG CHEST 1V PORT
1 series · 1 of 1 positions shown · non-contrast
Comparison: 11/11/2013

CLINICAL DATA: Hypotension, weakness

EXAM:
PORTABLE CHEST - 1 VIEW

[ap]
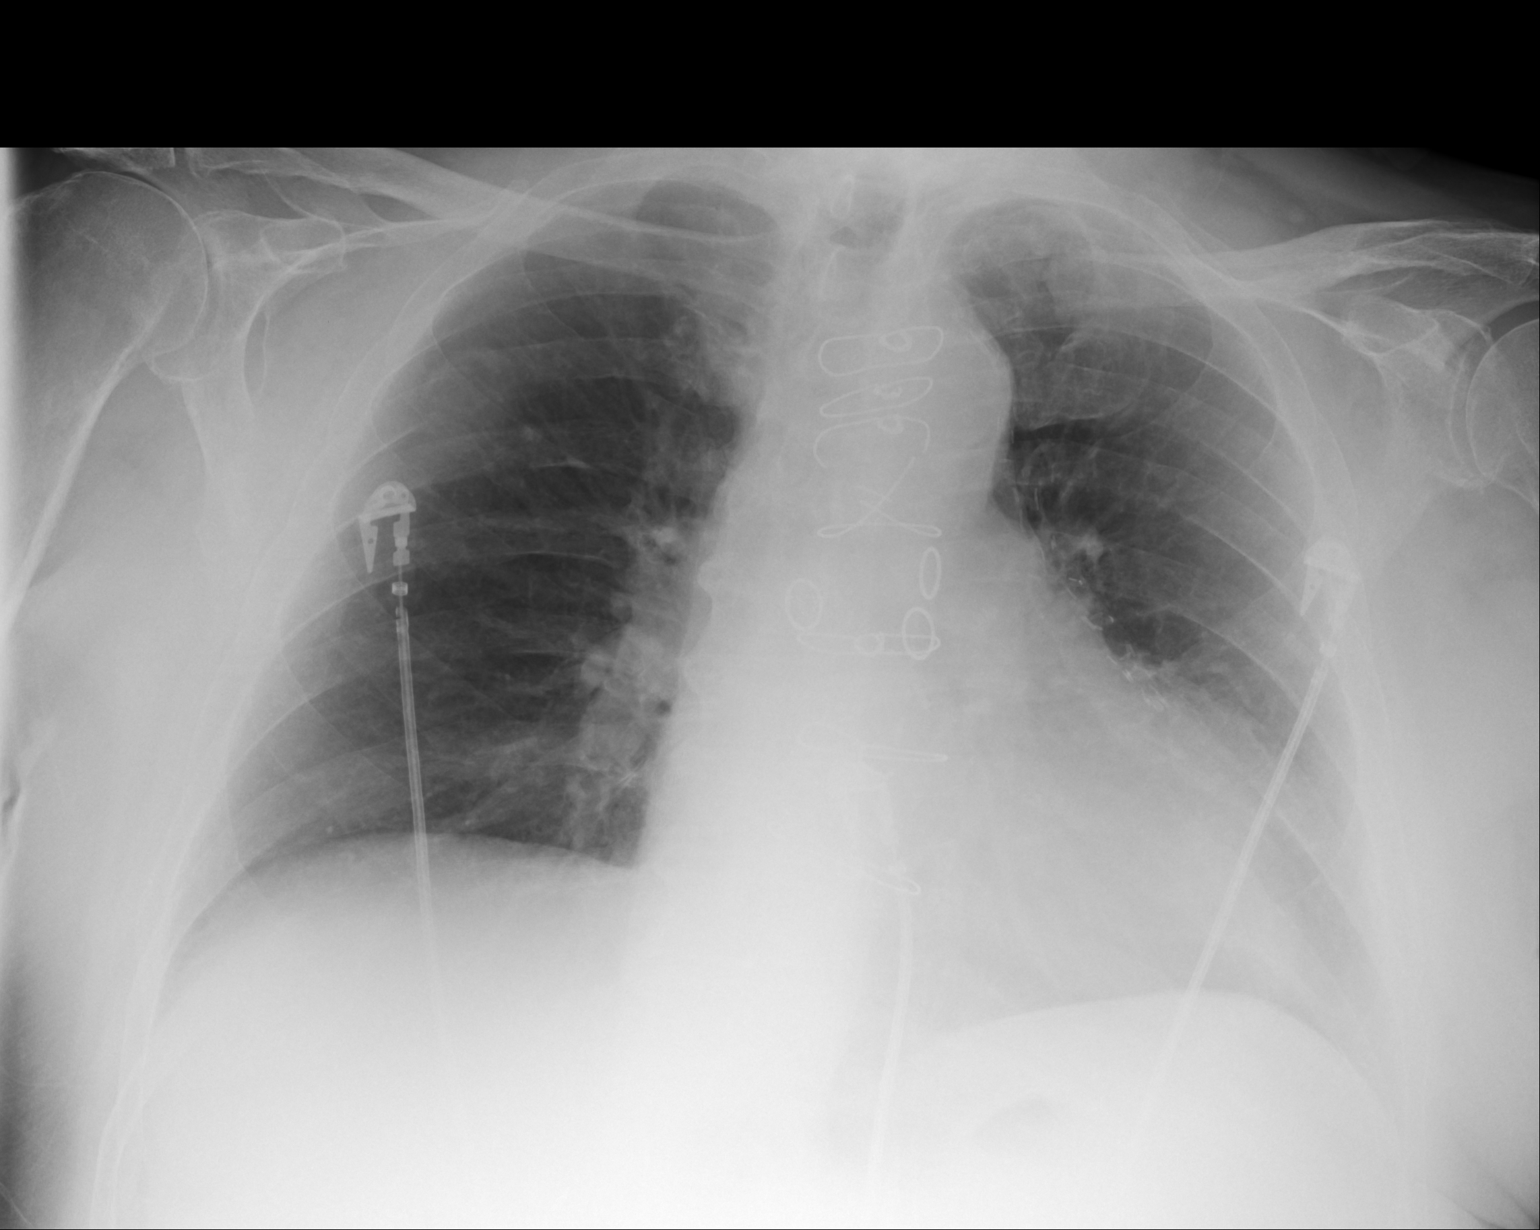

[1 of 1 positions shown; findings below may reference images not displayed]

FINDINGS: Cardiac shadow is mildly enlarged. No vascular congestion is seen.
Postsurgical changes are again noted. The lungs are clear
bilaterally. No acute bony abnormality is noted.
IMPRESSION: Slight increase in the cardiac shadow which may be accentuated by
the portable technique. No acute abnormality is noted.

## 2016-02-12 IMAGING — CT CT CHEST W/O CM
2 of 3 series · 15 of 46 positions shown, 17 images · non-contrast
Comparison: Chest radiograph 01/16/2014. 12/30/2011 bone survey. No
prior chest CT.

CLINICAL DATA: Right rib pain. Soft tissue mass/multiple myeloma.
Lymphocytic leukemia.

EXAM:
CT CHEST WITHOUT CONTRAST
TECHNIQUE: Multidetector CT imaging of the chest was performed following the
standard protocol without IV contrast..

[Series 2: routine chest wo · axial · 0.71mm/px · z∈[-572,-347]mm · 12 of 53 slices shown, 14 images]
[im 4/53  soft-tissue]
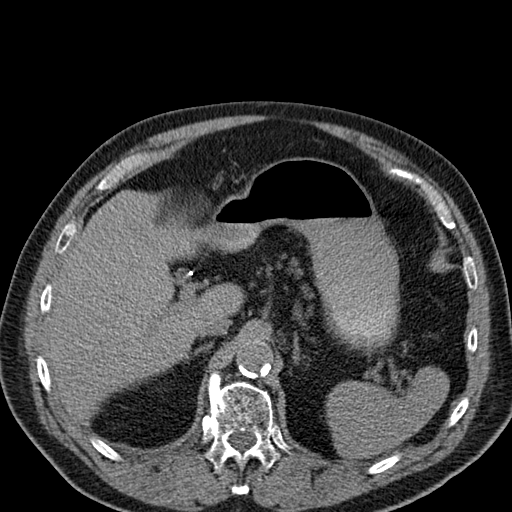
[im 4/53  bone]
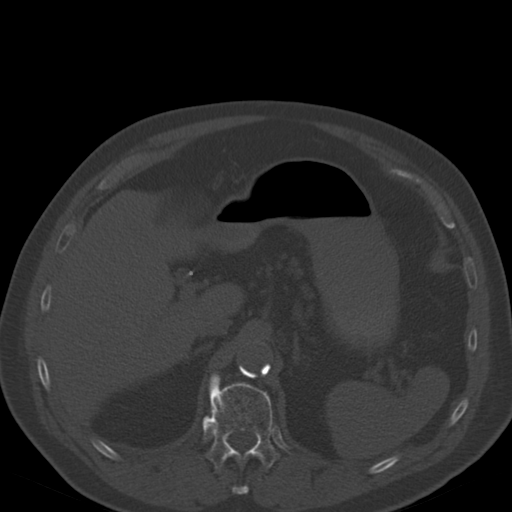
[im 7/53  soft-tissue]
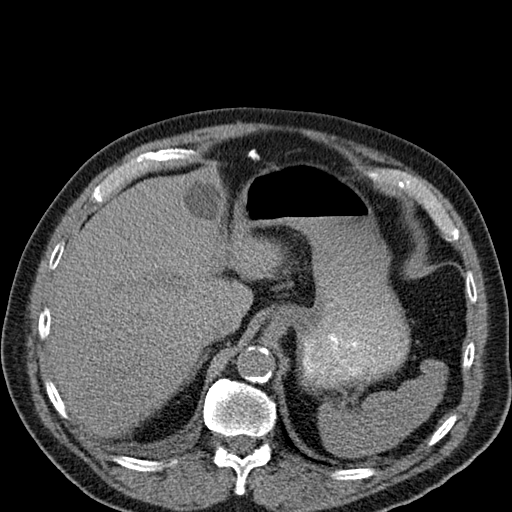
[im 12/53  soft-tissue]
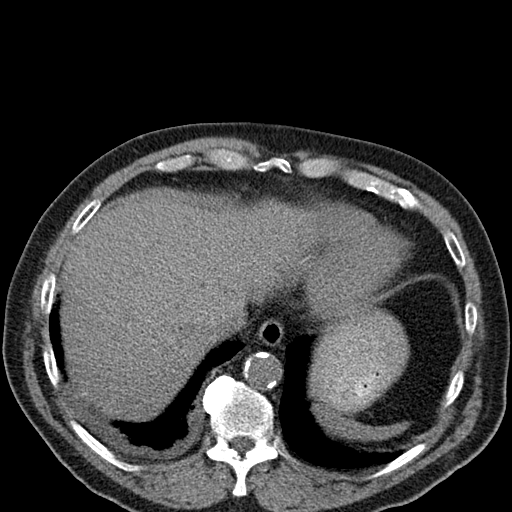
[im 16/53  soft-tissue]
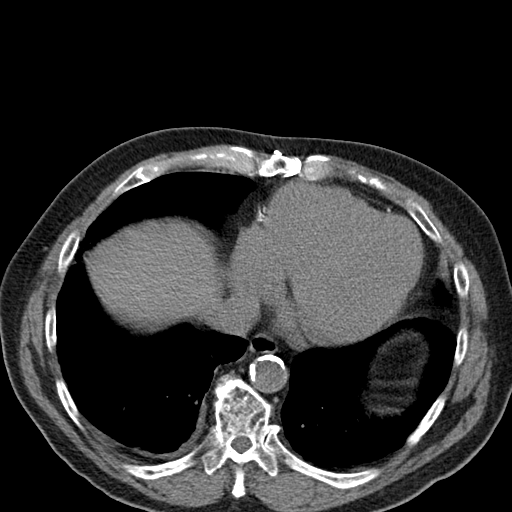
[im 21/53  soft-tissue]
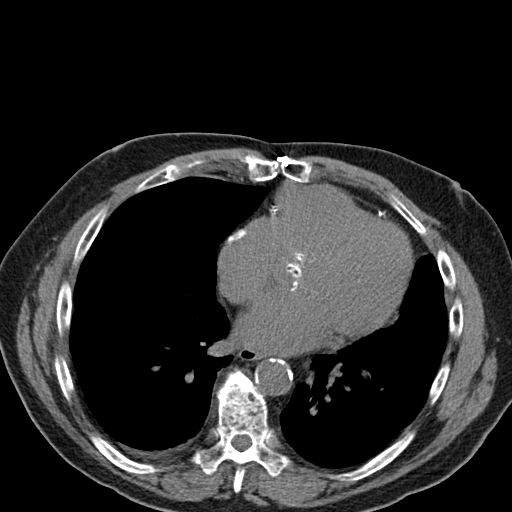
[im 24/53  soft-tissue]
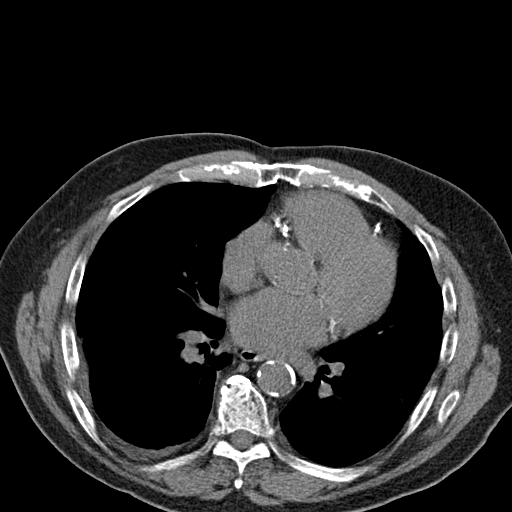
[im 29/53  soft-tissue]
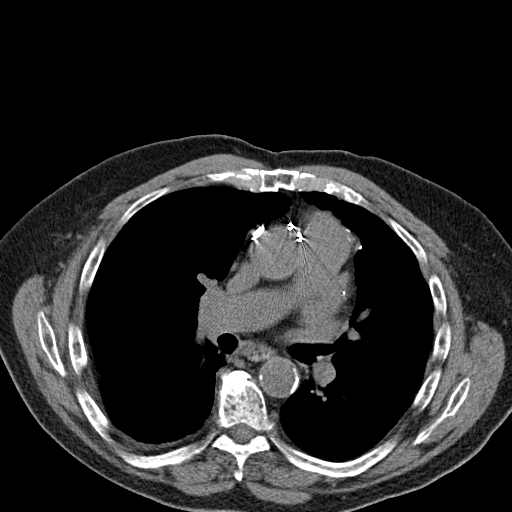
[im 32/53  soft-tissue]
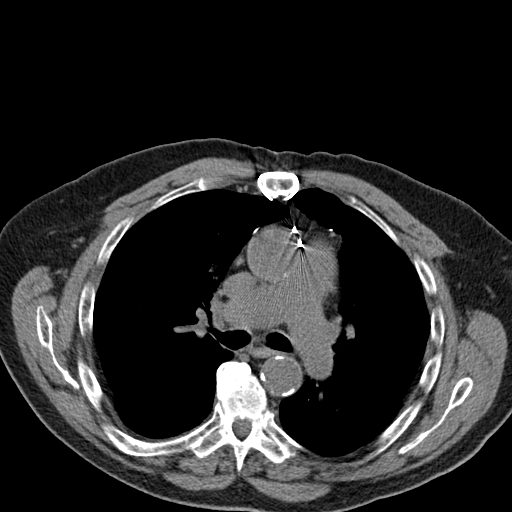
[im 37/53  soft-tissue]
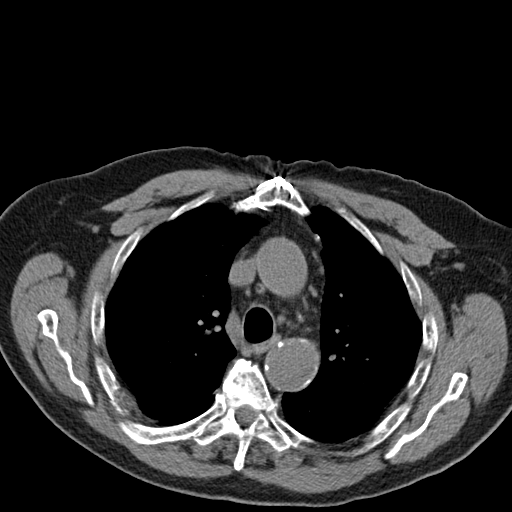
[im 37/53  bone]
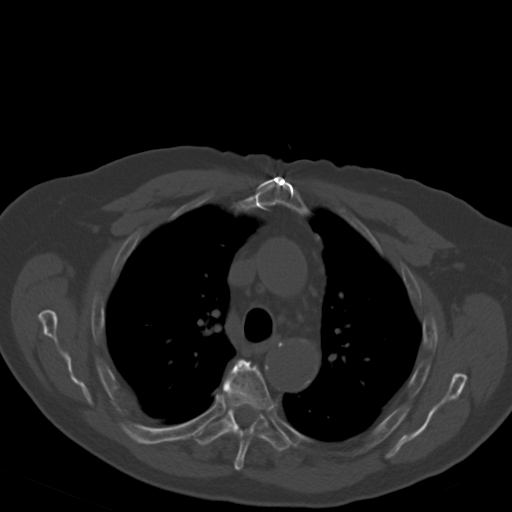
[im 41/53  soft-tissue]
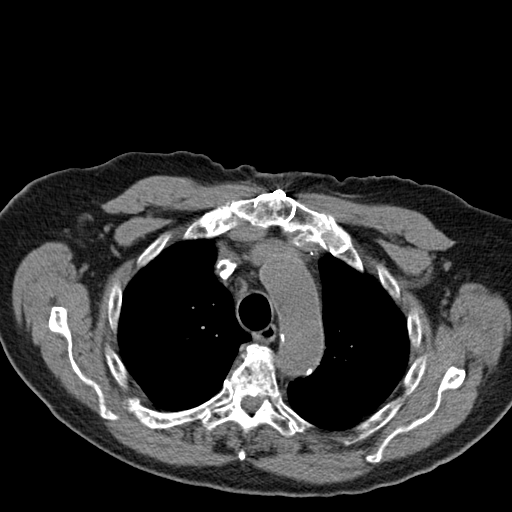
[im 46/53  soft-tissue]
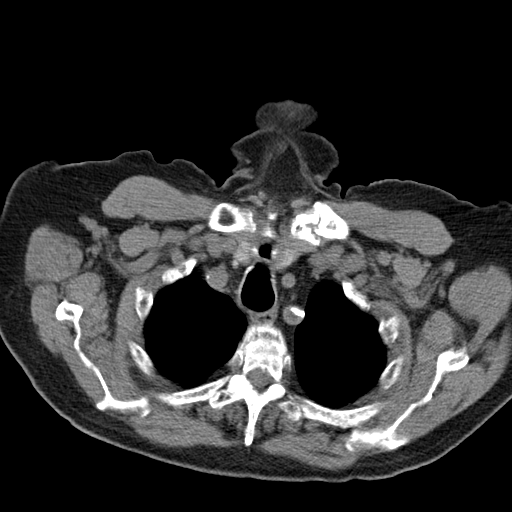
[im 49/53  soft-tissue]
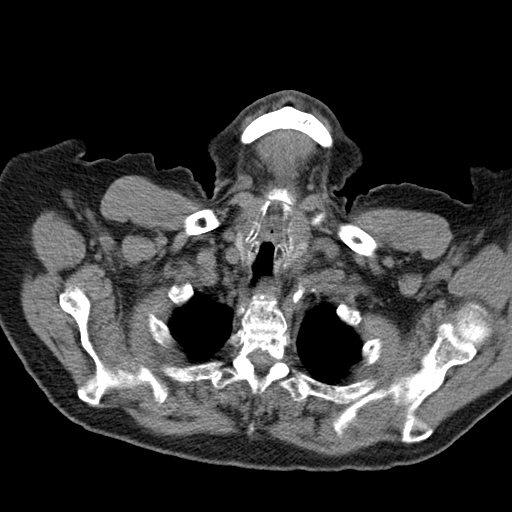

[Series 5: routine chest wo cor · coronal · 0.56mm/px · 3 of 142 slices shown]
[im 48/142  soft-tissue]
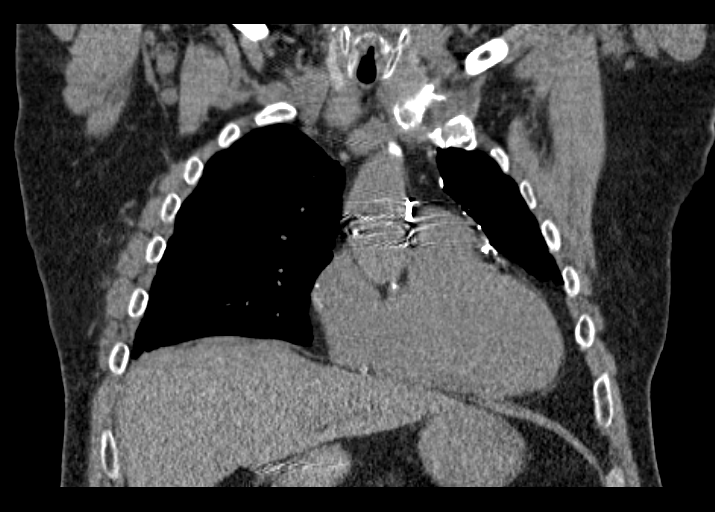
[im 63/142  soft-tissue]
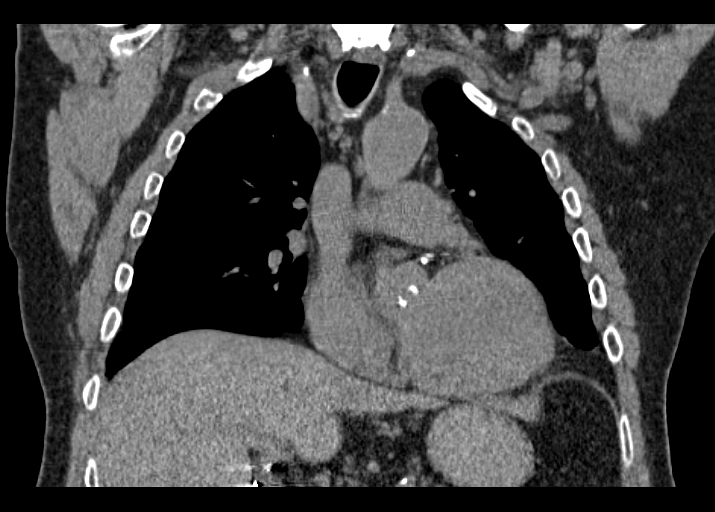
[im 79/142  soft-tissue]
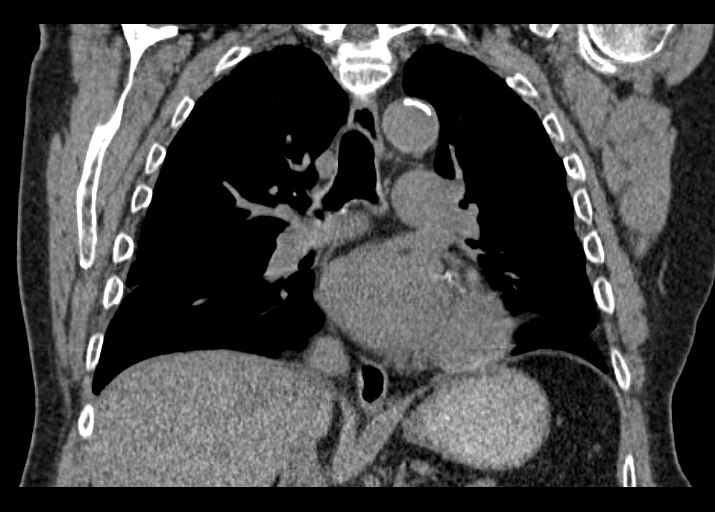

[15 of 46 positions shown; findings below may reference images not displayed]

FINDINGS: Lungs/Pleura: Subpleural 3 mm right upper lobe lung nodule or
nodules x2 on image 31.

Posterior right upper lobe calcified granuloma on image 16.

Scarring at the left lung base. Minimal motion degradation
inferiorly.

Trace right-sided pleural fluid.

Heart/Mediastinum: Aortic and branch vessel atherosclerosis. Mild
cardiomegaly. Prior median sternotomy. No pericardial effusion.
Pulmonary artery enlargement, with the outflow tract measuring
cm and the right pulmonary artery measuring 3.4 cm.

Multiple small middle and anterior mediastinal nodes. None are
pathologic by size criteria. Hilar regions poorly evaluated without
intravenous contrast.

Upper Abdomen: Left hepatic lobe cysts. Normal imaged portions of
the spleen, stomach, adrenal glands, kidneys. Fatty atrophy
throughout the pancreas.

Bones/Musculoskeletal: Innumerable lytic lesions throughout the
bones, consistent with the clinical history of multiple myeloma. An
expansile posterior right fifth rib lesion with cortical
disruption/pathologic fracture. 2.2 cm on image 16. A seventh
posterior lateral right rib fracture is likely pathologic and
minimally displaced. Image 28. No significant healing. Diffuse
idiopathic skeletal hyperostosis involving the thoracic spine.
IMPRESSION: 1. Osseous findings of multiple myeloma, including a dominant right
posterior fifth rib lesion with cortical destruction/pathologic
fracture. A seventh posterior right rib fracture which is also
favored to be pathologic. This could be acute or subacute, given
absence of significant healing.
2. Cardiomegaly. Pulmonary artery enlargement suggests pulmonary
arterial hypertension.
3. Trace right pleural fluid.
4. No thoracic adenopathy.

## 2017-05-12 NOTE — Telephone Encounter (Signed)
Error
